# Patient Record
Sex: Female | Born: 1995 | Race: Black or African American | Hispanic: No | Marital: Single | Smoking: Never smoker
Health system: Southern US, Community
[De-identification: ages and names within clinical notes are randomized; demographics above are authoritative.]

## PROBLEM LIST (undated history)

## (undated) DIAGNOSIS — Z950 Presence of cardiac pacemaker: Secondary | ICD-10-CM

## (undated) DIAGNOSIS — G459 Transient cerebral ischemic attack, unspecified: Secondary | ICD-10-CM

## (undated) DIAGNOSIS — I459 Conduction disorder, unspecified: Secondary | ICD-10-CM

## (undated) DIAGNOSIS — I82409 Acute embolism and thrombosis of unspecified deep veins of unspecified lower extremity: Secondary | ICD-10-CM

## (undated) HISTORY — PX: PACEMAKER INSERTION: SHX728

---
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
## 2016-07-14 ENCOUNTER — Other Ambulatory Visit (HOSPITAL_COMMUNITY)
Admission: EM | Admit: 2016-07-14 | Discharge: 2016-07-14 | Disposition: A | Discharge status: Home / Self Care | Payer: Medicaid Other | Source: Intra-hospital | Attending: Internal Medicine | Admitting: Internal Medicine

## 2016-07-14 DIAGNOSIS — Z029 Encounter for administrative examinations, unspecified: Secondary | ICD-10-CM | POA: Insufficient documentation

## 2016-07-14 LAB — PROTIME-INR
INR: 1.49
PROTHROMBIN TIME: 18.2 s — AB (ref 11.4–15.2)

## 2016-08-01 ENCOUNTER — Inpatient Hospital Stay (HOSPITAL_COMMUNITY)
Admission: EM | Admit: 2016-08-01 | Discharge: 2016-08-06 | DRG: 312 | Disposition: A | Discharge status: Home / Self Care | Payer: Medicaid Other | Attending: Internal Medicine | Admitting: Internal Medicine

## 2016-08-01 ENCOUNTER — Emergency Department (HOSPITAL_COMMUNITY): Payer: Medicaid Other

## 2016-08-01 ENCOUNTER — Encounter (HOSPITAL_COMMUNITY): Payer: Self-pay | Admitting: Emergency Medicine

## 2016-08-01 DIAGNOSIS — Z86718 Personal history of other venous thrombosis and embolism: Secondary | ICD-10-CM

## 2016-08-01 DIAGNOSIS — R51 Headache: Secondary | ICD-10-CM | POA: Diagnosis not present

## 2016-08-01 DIAGNOSIS — R791 Abnormal coagulation profile: Secondary | ICD-10-CM | POA: Diagnosis present

## 2016-08-01 DIAGNOSIS — Q246 Congenital heart block: Secondary | ICD-10-CM | POA: Diagnosis not present

## 2016-08-01 DIAGNOSIS — R55 Syncope and collapse: Principal | ICD-10-CM | POA: Diagnosis present

## 2016-08-01 DIAGNOSIS — Z79899 Other long term (current) drug therapy: Secondary | ICD-10-CM

## 2016-08-01 DIAGNOSIS — R4781 Slurred speech: Secondary | ICD-10-CM | POA: Diagnosis present

## 2016-08-01 DIAGNOSIS — Z950 Presence of cardiac pacemaker: Secondary | ICD-10-CM | POA: Diagnosis present

## 2016-08-01 DIAGNOSIS — Z7901 Long term (current) use of anticoagulants: Secondary | ICD-10-CM

## 2016-08-01 DIAGNOSIS — R519 Headache, unspecified: Secondary | ICD-10-CM | POA: Diagnosis present

## 2016-08-01 DIAGNOSIS — Z8673 Personal history of transient ischemic attack (TIA), and cerebral infarction without residual deficits: Secondary | ICD-10-CM

## 2016-08-01 DIAGNOSIS — G8929 Other chronic pain: Secondary | ICD-10-CM | POA: Diagnosis present

## 2016-08-01 HISTORY — DX: Presence of cardiac pacemaker: Z95.0

## 2016-08-01 HISTORY — DX: Conduction disorder, unspecified: I45.9

## 2016-08-01 HISTORY — DX: Acute embolism and thrombosis of unspecified deep veins of unspecified lower extremity: I82.409

## 2016-08-01 HISTORY — DX: Transient cerebral ischemic attack, unspecified: G45.9

## 2016-08-01 LAB — URINALYSIS, ROUTINE W REFLEX MICROSCOPIC
Bilirubin Urine: NEGATIVE
Glucose, UA: NEGATIVE mg/dL
Hgb urine dipstick: NEGATIVE
KETONES UR: NEGATIVE mg/dL
NITRITE: NEGATIVE
PH: 6.5 (ref 5.0–8.0)
Protein, ur: NEGATIVE mg/dL
SPECIFIC GRAVITY, URINE: 1.015 (ref 1.005–1.030)

## 2016-08-01 LAB — COMPREHENSIVE METABOLIC PANEL
ALBUMIN: 4.3 g/dL (ref 3.5–5.0)
ALT: 14 U/L (ref 14–54)
ANION GAP: 7 (ref 5–15)
AST: 23 U/L (ref 15–41)
Alkaline Phosphatase: 53 U/L (ref 38–126)
BUN: 10 mg/dL (ref 6–20)
CALCIUM: 9.6 mg/dL (ref 8.9–10.3)
CHLORIDE: 106 mmol/L (ref 101–111)
CO2: 26 mmol/L (ref 22–32)
Creatinine, Ser: 0.97 mg/dL (ref 0.44–1.00)
GFR calc non Af Amer: >60 mL/min (ref >60)
GLUCOSE: 83 mg/dL (ref 65–99)
POTASSIUM: 4.1 mmol/L (ref 3.5–5.1)
SODIUM: 139 mmol/L (ref 135–145)
Total Bilirubin: 1.4 mg/dL — ABNORMAL HIGH (ref 0.3–1.2)
Total Protein: 7.9 g/dL (ref 6.5–8.1)

## 2016-08-01 LAB — I-STAT CHEM 8, ED
BUN: 12 mg/dL (ref 6–20)
CALCIUM ION: 1.17 mmol/L (ref 1.15–1.40)
Chloride: 103 mmol/L (ref 101–111)
Creatinine, Ser: 0.9 mg/dL (ref 0.44–1.00)
GLUCOSE: 81 mg/dL (ref 65–99)
HCT: 43 % (ref 36.0–46.0)
HEMOGLOBIN: 14.6 g/dL (ref 12.0–15.0)
Potassium: 4 mmol/L (ref 3.5–5.1)
SODIUM: 140 mmol/L (ref 135–145)
TCO2: 25 mmol/L (ref 0–100)

## 2016-08-01 LAB — CBC WITH DIFFERENTIAL/PLATELET
BASOS ABS: 0 10*3/uL (ref 0.0–0.1)
Basophils Relative: 0 %
EOS ABS: 0.1 10*3/uL (ref 0.0–0.7)
EOS PCT: 1 %
HCT: 39.5 % (ref 36.0–46.0)
Hemoglobin: 14 g/dL (ref 12.0–15.0)
LYMPHS ABS: 1.8 10*3/uL (ref 0.7–4.0)
LYMPHS PCT: 34 %
MCH: 31.7 pg (ref 26.0–34.0)
MCHC: 35.4 g/dL (ref 30.0–36.0)
MCV: 89.4 fL (ref 78.0–100.0)
MONO ABS: 0.2 10*3/uL (ref 0.1–1.0)
Monocytes Relative: 4 %
Neutro Abs: 3.2 10*3/uL (ref 1.7–7.7)
Neutrophils Relative %: 61 %
PLATELETS: 176 10*3/uL (ref 150–400)
RBC: 4.42 MIL/uL (ref 3.87–5.11)
RDW: 12.1 % (ref 11.5–15.5)
WBC: 5.2 10*3/uL (ref 4.0–10.5)

## 2016-08-01 LAB — CBG MONITORING, ED: Glucose-Capillary: 89 mg/dL (ref 65–99)

## 2016-08-01 LAB — PROTIME-INR
INR: 1.77
PROTHROMBIN TIME: 20.8 s — AB (ref 11.4–15.2)

## 2016-08-01 LAB — I-STAT BETA HCG BLOOD, ED (MC, WL, AP ONLY): I-stat hCG, quantitative: <5 m[IU]/mL (ref <5)

## 2016-08-01 LAB — URINALYSIS, MICROSCOPIC (REFLEX): RBC / HPF: NONE SEEN RBC/hpf (ref 0–5)

## 2016-08-01 MED ORDER — WARFARIN - PHARMACIST DOSING INPATIENT
Freq: Every day | Status: DC
  Administered 2016-08-03 – 2016-08-04 (×2)

## 2016-08-01 MED ORDER — WARFARIN SODIUM 6 MG PO TABS
6.0000 mg | ORAL_TABLET | Freq: Once | ORAL | Status: AC
  Administered 2016-08-01: 6 mg via ORAL
  Filled 2016-08-01: qty 1

## 2016-08-01 MED ORDER — SODIUM CHLORIDE 0.9% FLUSH
3.0000 mL | Freq: Two times a day (BID) | INTRAVENOUS | Status: DC
  Administered 2016-08-02 – 2016-08-05 (×3): 3 mL via INTRAVENOUS

## 2016-08-01 MED ORDER — SODIUM CHLORIDE 0.9% FLUSH
3.0000 mL | Freq: Two times a day (BID) | INTRAVENOUS | Status: DC
  Administered 2016-08-02 – 2016-08-05 (×5): 3 mL via INTRAVENOUS

## 2016-08-01 MED ORDER — SODIUM CHLORIDE 0.9 % IV SOLN: 250.0000 mL | INTRAVENOUS | Status: DC | PRN

## 2016-08-01 MED ORDER — SODIUM CHLORIDE 0.9% FLUSH: 3.0000 mL | INTRAVENOUS | Status: DC | PRN

## 2016-08-01 MED ORDER — ATENOLOL 25 MG PO TABS: 25.0000 mg | ORAL_TABLET | Freq: Every day | ORAL | Status: DC

## 2016-08-01 MED ORDER — ATENOLOL 25 MG PO TABS
25.0000 mg | ORAL_TABLET | Freq: Once | ORAL | Status: AC
  Administered 2016-08-01: 25 mg via ORAL
  Filled 2016-08-01: qty 1

## 2016-08-01 MED ORDER — IBUPROFEN 600 MG PO TABS: 600.0000 mg | ORAL_TABLET | Freq: Four times a day (QID) | ORAL | Status: DC | PRN

## 2016-08-01 MED ORDER — ACETAMINOPHEN 650 MG RE SUPP: 650.0000 mg | Freq: Four times a day (QID) | RECTAL | Status: DC | PRN

## 2016-08-01 MED ORDER — ACETAMINOPHEN 325 MG PO TABS: 650.0000 mg | ORAL_TABLET | Freq: Four times a day (QID) | ORAL | Status: DC | PRN

## 2016-08-01 MED ORDER — WARFARIN SODIUM 1 MG PO TABS: 1.0000 mg | ORAL_TABLET | Freq: Once | ORAL | Status: DC

## 2016-08-01 MED ORDER — WARFARIN SODIUM 5 MG PO TABS: 5.0000 mg | ORAL_TABLET | Freq: Once | ORAL | Status: DC

## 2016-08-01 NOTE — ED Provider Notes (Signed)
[facility]-EM[facility]ENCY DEPT Provider Note   CSN: [id number] Arrival date & time: 08/01/16  1831     History   Chief Complaint Chief Complaint  Patient presents with  . Loss of Consciousness    HPI Elizabeth Ballard is a 20 y.o. female with a very complicated past medical history. [place] and [facility] has [facility]. 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 She has a history of a DVT forming on 1 of the leads of her pacemaker. The clot embolized and she ended up having a TIA. She also has a previous history of DVT. Today, the Patient states that she was at her desk studying when she had sudden onset of acute frontal headache. The patient was using her SnapChat app and has a video of herself syncopizing during the onset of the headache. The patient states that it happened a second time after she woke up. She called her mother who is on the speaker phone during the interview and states that she had significantly slurred speech for about 15 minutes. The patient was able to call 911 and came to the emergency department.  Patient continues to have a significant frontal headache. She is on Coumadin and states that she has been taking it as she is directed and has not been eating any green vegetables. Patient was seen by her cardiologist last month and had her pacemaker interrogated. She states that they told her, "the top part of my heart was not beating correctly with the bottom part of my heart." The patient denies any chest pain or shortness of breath. She does not have a history of significant headaches.  HPI  Past Medical History:  Diagnosis Date  . Acute deep vein thrombosis (DVT) (HCC)   . Heart block   . TIA (transient ischemic attack)     There are no active problems to display for this patient.   Past Surgical History:  Procedure Laterality Date  . PACEMAKER  INSERTION      OB History    No data available       Home Medications    Prior to Admission medications   Not on File    Family History No family history on file.  Social History Social History  Substance Use Topics  . Smoking status: Never Smoker  . Smokeless tobacco: Never Used  . Alcohol use Yes     Allergies   Patient has no allergy information on record.   Review of Systems Review of Systems  Ten systems reviewed and are negative for acute change, except as noted in the HPI.   Physical Exam Updated Vital Signs BP 123/87 (BP Location: Right Arm)   Pulse 72   Temp 98.7 F (37.1 C) (Oral)   Resp 16   Ht 5\' 3"  (1.6 m)   Wt 70.3 kg   LMP 07/06/2016 (Approximate)   SpO2 100%   BMI 27.46 kg/m   Physical Exam  Constitutional: She is oriented to person, place, and time. She appears well-developed and well-nourished. No distress.  HENT:  Head: Normocephalic and atraumatic.  Eyes: Conjunctivae are normal. No scleral icterus.  Neck: Normal range of motion.  Cardiovascular: Normal rate, regular rhythm and normal heart sounds.  Exam reveals no gallop and no friction rub.   No murmur heard. Well healed surgical scars on the chest  Pulmonary/Chest: Effort normal and breath  sounds normal. No respiratory distress.  Abdominal: Soft. Bowel sounds are normal. She exhibits no distension and no mass. There is no tenderness. There is no guarding.  Neurological: She is alert and oriented to person, place, and time.  Speech is clear and goal oriented, follows commands Major Cranial nerves without deficit, no facial droop Normal strength in upper and lower extremities bilaterally including dorsiflexion and plantar flexion, strong and equal grip strength Sensation normal to light and sharp touch Moves extremities without ataxia, coordination intact Normal finger to nose and rapid alternating movements Neg romberg, no pronator drift Normal gait Normal heel-shin and  balance  Skin: Skin is warm and dry. She is not diaphoretic.  Nursing note and vitals reviewed.    ED Treatments / Results  Labs (all labs ordered are listed, but only abnormal results are displayed) Labs Reviewed  CBC WITH DIFFERENTIAL/PLATELET  COMPREHENSIVE METABOLIC PANEL  URINALYSIS, ROUTINE W REFLEX MICROSCOPIC  PROTIME-INR  POCT CBG (FASTING - GLUCOSE)-MANUAL ENTRY  I-STAT CHEM 8, ED  I-STAT BETA HCG BLOOD, ED ([facility], [facility], [facility] ONLY)    EKG  EKG Interpretation None       Radiology No results found.  Procedures Procedures (including critical care time)  Medications Ordered in ED Medications - No data to display   Initial Impression / Assessment and Plan / ED Course  I have reviewed the triage vital signs and the nursing notes.  Pertinent labs & imaging results that were available during my care of the patient were reviewed by me and considered in my medical decision making (see chart for details).  Clinical Course as of Aug 02 2027  Wed Aug 01, 2016  2025 Patient Medtronic pace maker evaluated. No abnormalities. EKG pending. Ct pending. Patient seen in shared visit with Dr. Jacqulyn BathLong. I have given sign out to PA Muthersbaugh.   [AH]    Clinical Course User Index [AH] Arthor CaptainAbigail Jubilee Vivero, PA-C      Final Clinical Impressions(s) / ED Diagnoses   Final diagnoses:  None    New Prescriptions New Prescriptions   No medications on file     Arthor Captainbigail Evany Schecter, PA-C 08/01/16 2029    Maia PlanJoshua G Long, MD 08/01/16 [phone]

## 2016-08-01 NOTE — ED Provider Notes (Signed)
Care assumed from [place], [place].  Elizabeth Ballard is a 20 y.o. female presents with a past medical history of complete heart block with pacemaker insertion at age 853 days old, DVT secondary to clot on the lead of her pacemaker causing TIA. She is anticoagulated on Coumadin. Denies missed doses or eating green vegetables. Today she was at her desk studying when she developed a sudden onset headache and had syncope 2. Mother states 15 minutes of slurred speech.  On initial providers exam patient with normal neurologic exam. Patient was without chest pain or shortness of breath during the incident. No prodrome.  Physical Exam  BP 108/83 (BP Location: Right Arm)   Pulse 70   Temp 98.7 F (37.1 C) (Oral)   Resp 26   Ht 5\' 3"  (1.6 m)   Wt 70.3 kg   LMP 07/06/2016 (Approximate)   SpO2 100%   BMI 27.46 kg/m   Physical Exam  Constitutional: She appears well-developed and well-nourished. No distress.  HENT:  Head: Normocephalic.  Eyes: Conjunctivae are normal. No scleral icterus.  Neck: Normal range of motion.  Cardiovascular: Normal rate and intact distal pulses.   Murmur heard. Pulmonary/Chest: Effort normal.  Musculoskeletal: Normal range of motion.  Neurological: She is alert.  Skin: Skin is warm and dry.   Results for orders placed or performed during the hospital encounter of 08/01/16  CBC WITH DIFFERENTIAL  Result Value Ref Range   WBC 5.2 4.0 - 10.5 K/uL   RBC 4.42 3.87 - 5.11 MIL/uL   Hemoglobin 14.0 12.0 - 15.0 g/dL   HCT 16.139.5 09.636.0 - 04.546.0 %   MCV 89.4 78.0 - 100.0 fL   MCH 31.7 26.0 - 34.0 pg   MCHC 35.4 30.0 - 36.0 g/dL   RDW 40.912.1 81.111.5 - 91.415.5 %   Platelets 176 150 - 400 K/uL   Neutrophils Relative % 61 %   Neutro Abs 3.2 1.7 - 7.7 K/uL   Lymphocytes Relative 34 %   Lymphs Abs 1.8 0.7 - 4.0 K/uL   Monocytes Relative 4 %   Monocytes Absolute 0.2 0.1 - 1.0 K/uL   Eosinophils Relative 1 %   Eosinophils Absolute 0.1 0.0 - 0.7 K/uL   Basophils Relative 0 %    Basophils Absolute 0.0 0.0 - 0.1 K/uL  Comprehensive metabolic panel  Result Value Ref Range   Sodium 139 135 - 145 mmol/L   Potassium 4.1 3.5 - 5.1 mmol/L   Chloride 106 101 - 111 mmol/L   CO2 26 22 - 32 mmol/L   Glucose, Bld 83 65 - 99 mg/dL   BUN 10 6 - 20 mg/dL   Creatinine, Ser 7.820.97 0.44 - 1.00 mg/dL   Calcium 9.6 8.9 - 95.610.3 mg/dL   Total Protein 7.9 6.5 - 8.1 g/dL   Albumin 4.3 3.5 - 5.0 g/dL   AST 23 15 - 41 U/L   ALT 14 14 - 54 U/L   Alkaline Phosphatase 53 38 - 126 U/L   Total Bilirubin 1.4 (H) 0.3 - 1.2 mg/dL   GFR calc non Af Amer >60 >60 mL/min   GFR calc Af Amer >60 >60 mL/min   Anion gap 7 5 - 15  Urinalysis, Routine w reflex microscopic  Result Value Ref Range   Color, Urine YELLOW YELLOW   APPearance CLEAR CLEAR   Specific Gravity, Urine 1.015 1.005 - 1.030   pH 6.5 5.0 - 8.0   Glucose, UA NEGATIVE NEGATIVE mg/dL   Hgb urine dipstick NEGATIVE NEGATIVE  Bilirubin Urine NEGATIVE NEGATIVE   Ketones, ur NEGATIVE NEGATIVE mg/dL   Protein, ur NEGATIVE NEGATIVE mg/dL   Nitrite NEGATIVE NEGATIVE   Leukocytes, UA TRACE (A) NEGATIVE  Protime-INR  Result Value Ref Range   Prothrombin Time 20.8 (H) 11.4 - 15.2 seconds   INR 1.77   Urinalysis, Microscopic (reflex)  Result Value Ref Range   RBC / HPF NONE SEEN 0 - 5 RBC/hpf   WBC, UA 0-5 0 - 5 WBC/hpf   Bacteria, UA RARE (A) NONE SEEN   Squamous Epithelial / LPF 0-5 (A) NONE SEEN  I-stat chem 8, ED  (not at [facility])  Result Value Ref Range   Sodium 140 135 - 145 mmol/L   Potassium 4.0 3.5 - 5.1 mmol/L   Chloride 103 101 - 111 mmol/L   BUN 12 6 - 20 mg/dL   Creatinine, Ser 4.090.90 0.44 - 1.00 mg/dL   Glucose, Bld 81 65 - 99 mg/dL   Calcium, Ion 8.111.17 9.141.15 - 1.40 mmol/L   TCO2 25 0 - 100 mmol/L   Hemoglobin 14.6 12.0 - 15.0 g/dL   HCT 78.243.0 95.636.0 - 21.346.0 %  I-Stat Beta hCG blood, ED ([facility], [facility], [facility] only)  Result Value Ref Range   I-stat hCG, quantitative <5.0 <5 mIU/mL   Comment 3          CBG monitoring, ED   Result Value Ref Range   Glucose-Capillary 89 65 - 99 mg/dL   Comment 1 Document in Chart    Dg Chest 2 View  Result Date: 08/01/2016 CLINICAL DATA:  Headache, syncope. EXAM: CHEST  2 VIEW COMPARISON:  None. FINDINGS: Left-sided pacemaker with leads projecting over the right atrium and ventricle. We from prior abdominal pacemaker also in place.The cardiomediastinal contours are normal. The lungs are clear. Pulmonary vasculature is normal. No consolidation, pleural effusion, or pneumothorax. No acute osseous abnormalities are seen. IMPRESSION: No active cardiopulmonary disease. Pacemaker in place. Electronically Signed   By: Rubye OaksMelanie  Ehinger M.D.   On: 08/01/2016 20:49   Ct Head Wo Contrast  Result Date: 08/01/2016 CLINICAL DATA:  Dizzy. Syncopal episode while at home alone. Headache. EXAM: CT HEAD WITHOUT CONTRAST TECHNIQUE: Contiguous axial images were obtained from the base of the skull through the vertex without intravenous contrast. COMPARISON:  None. FINDINGS: Brain: No acute infarct, hemorrhage, or mass lesion is present. The ventricles are of normal size. No significant extraaxial fluid collection is present. No significant white matter disease is present. The brainstem and cerebellum are normal. Vascular: No hyperdense vessel or unexpected calcification. Skull: The calvarium is within normal limits. No focal lytic or blastic lesions are present. Sinuses/Orbits: The paranasal sinuses and mastoid air cells are clear. The globes and orbits are intact. Other: IMPRESSION: Negative CT of the head. Electronically Signed   By: Marin Robertshristopher  Mattern M.D.   On: 08/01/2016 20:42    ED Course  Procedures  Clinical Course as of Aug 01 2318  Wed Aug 01, 2016  2025 Patient Medtronic pace maker evaluated. No abnormalities. EKG pending. Ct pending. Patient seen in shared visit with Dr. Jacqulyn BathLong. I have given sign out to PA Avianna Moynahan.   [AH]  2046 Plan: CT scan, orthostatic vital signs, EKG, chest x-ray  are pending. Anticipate admission.  [HM]  2046 INR is subtherapeutic. INR: 1.77 [HM]  2046 No anemia and patient denies any bleeding. Hemoglobin: 14.0 [HM]  2046 I-stat hCG, quantitative: <5.0 [HM]  2046 No evidence of UTI. Leukocytes, UA: (!) TRACE [HM]  2046  No leukocytosis. WBC: 5.2 [HM]  2047 No electrolyte abnormalities. Potassium: 4.1 [HM]  2047 Atrial sensed ventricular pacemaker EKG 12-Lead [HM]  2047 CT scan without evidence of ICH. This was completed within a 6 hour window of headache onset. Doubt subarachnoid hemorrhage. CT Head Wo Contrast [HM]  2047 Vital signs stable. No tachycardia. BP: 123/87 [HM]  2248 No cardiac pulmonary process. Pacemaker noted. DG Chest 2 View [HM]  2249 Discussed with Dr. Antionette Char who will admit to tele obs.  Transfer of care to hospitalist service.  [HM]    Clinical Course User Index [AH] Arthor Captain, PA-C [HM] Dierdre Forth, PA-C     MDM Patient with syncope without prodrome at home. Associated with headache. No overt seizure activity but did have some eye fluttering. Has never followed up with neurology. Normal neurologic exam today for initial provider. Pacemaker interrogation without abnormality. CT scan without evidence of subarachnoid hemorrhage. Patient will need to be admitted for further workup for possible TIA versus seizure activity. Discussed with patient is amenable to the plan.  Syncope and collapse  Pacemaker  Nonintractable headache, unspecified chronicity pattern, unspecified headache type     Dierdre Forth, PA-C 08/01/16 2319    Maia Plan, MD 08/01/16 [phone]

## 2016-08-01 NOTE — H&P (Signed)
History and Physical    Elizabeth Ballard ZOX:[id number] DOB: 10-08-1995 DOA: 08/01/2016  PCP: Pcp Not In System   Patient coming from: Home Chief Complaint: Syncope  HPI: Elizabeth Ballard is a 20 y.o. female with medical history significant for congenital heart block with pacer, history of DVT on Coumadin, and chronic headaches who presents to the emergency department after 2 syncopal episodes at home. Patient reports that she woke in her usual state of health today and was having an uneventful day when she noted development of a frontal headache this evening. She describes the headache as the same as her typical chronic headache, without nausea or change in vision or hearing. Shortly after development of a headache, the patient reports experiencing a syncopal episode, and then a second syncopal episode later tonight. Her mother was concerned that she was slurring her speech immediately after this episode and patient comes in to the ED for evaluation of this. She reports a very similar episode approximately 2 years ago which was ultimately attributed to a TIA. She denies any recent fevers or chills, chest pain or palpitations, leg swelling or orthopnea. She denies any recent fall or trauma and denies change in vision or hearing, loss of coordination, confusion, or focal numbness or weakness. Patient is accompanied by her cousin who notes that the patient has seemed to be increasingly anxious since recently starting college, notes that the patient has a history of anxiety, and offers her opinion that this is likely a manifestation of that.  ED Course: Upon arrival to the ED, patient is found to be afebrile, saturating well on room air, and with vital signs stable. EKG reveals a ventricular paced rhythm. Chest x-ray is negative for acute cardiopulmonary disease and noncontrast head CT is negative for acute intracranial abnormality. CMP is unremarkable and CBC is also within the normal limits. Urinalysis  is unremarkable and INR is subtherapeutic at 1.77. Patient was given her evening doses of atenolol and warfarin in the emergency department. There were no focal neurologic deficits observed in the ED and the patient reports resolution of her headache. She will be observed on the telemetry unit for ongoing evaluation and management of syncope.  Review of Systems:  All other systems reviewed and apart from HPI, are negative.  Past Medical History:  Diagnosis Date  . Acute deep vein thrombosis (DVT) (HCC)   . Heart block   . TIA (transient ischemic attack)     Past Surgical History:  Procedure Laterality Date  . PACEMAKER INSERTION       reports that she has never smoked. She has never used smokeless tobacco. She reports that she drinks alcohol. Her drug history is not on file.  No Known Allergies  History reviewed. No pertinent family history.   Prior to Admission medications   Medication Sig Start Date End Date Taking? Authorizing Provider  atenolol (TENORMIN) 25 MG tablet Take 25 mg by mouth daily.   Yes Historical Provider, MD  warfarin (COUMADIN) 1 MG tablet Take 6 mg by mouth daily.   Yes Historical Provider, MD  warfarin (COUMADIN) 5 MG tablet Take 6 mg by mouth daily.   Yes Historical Provider, MD    Physical Exam: Vitals:   08/01/16 1954 08/01/16 1955 08/01/16 2115 08/01/16 2145  BP: 117/74 108/83 106/78 126/83  Pulse: 68 70 64 71  Resp: 18 26 18 24   Temp:      TempSrc:      SpO2: 100% 100% 100% 100%  Weight:  Height:          Constitutional: NAD, calm, comfortable Eyes: PERTLA, lids and conjunctivae normal ENMT: Mucous membranes are moist. Posterior pharynx clear of any exudate or lesions.   Neck: normal, supple, no masses, no thyromegaly Respiratory: clear to auscultation bilaterally, no wheezing, no crackles. Normal respiratory effort.   Cardiovascular: S1 & S2 heard, regular rate and rhythm, no significant murmur. No extremity edema. No carotid bruits.  No significant JVD. Abdomen: No distension, no tenderness, no masses palpated. Bowel sounds normal.  Musculoskeletal: no clubbing / cyanosis. No joint deformity upper and lower extremities. Normal muscle tone.  Skin: no significant rashes, lesions, ulcers. Warm, dry, well-perfused. Neurologic: CN 2-12 grossly intact. Sensation intact, DTR normal. Strength 5/5 in all 4 limbs.  Psychiatric: Normal judgment and insight. Alert and oriented x 3. Normal mood and affect.     Labs on Admission: I have personally reviewed following labs and imaging studies  CBC:  Recent Labs Lab 08/01/16 1929 08/01/16 1937  WBC 5.2  --   NEUTROABS 3.2  --   HGB 14.0 14.6  HCT 39.5 43.0  MCV 89.4  --   PLT 176  --    Basic Metabolic Panel:  Recent Labs Lab 08/01/16 1929 08/01/16 1937  NA 139 140  K 4.1 4.0  CL 106 103  CO2 26  --   GLUCOSE 83 81  BUN 10 12  CREATININE 0.97 0.90  CALCIUM 9.6  --    GFR: Estimated Creatinine Clearance: 94.6 mL/min (by C-G formula based on SCr of 0.9 mg/dL). Liver Function Tests:  Recent Labs Lab 08/01/16 1929  AST 23  ALT 14  ALKPHOS 53  BILITOT 1.4*  PROT 7.9  ALBUMIN 4.3   No results for input(s): LIPASE, AMYLASE in the last 168 hours. No results for input(s): AMMONIA in the last 168 hours. Coagulation Profile:  Recent Labs Lab 08/01/16 1929  INR 1.77   Cardiac Enzymes: No results for input(s): CKTOTAL, CKMB, CKMBINDEX, TROPONINI in the last 168 hours. BNP (last 3 results) No results for input(s): PROBNP in the last 8760 hours. HbA1C: No results for input(s): HGBA1C in the last 72 hours. CBG:  Recent Labs Lab 08/01/16 1950  GLUCAP 89   Lipid Profile: No results for input(s): CHOL, HDL, LDLCALC, TRIG, CHOLHDL, LDLDIRECT in the last 72 hours. Thyroid Function Tests: No results for input(s): TSH, T4TOTAL, FREET4, T3FREE, THYROIDAB in the last 72 hours. Anemia Panel: No results for input(s): VITAMINB12, FOLATE, FERRITIN, TIBC, IRON,  RETICCTPCT in the last 72 hours. Urine analysis:    Component Value Date/Time   COLORURINE YELLOW 08/01/2016 1858   APPEARANCEUR CLEAR 08/01/2016 1858   LABSPEC 1.015 08/01/2016 1858   PHURINE 6.5 08/01/2016 1858   GLUCOSEU NEGATIVE 08/01/2016 1858   HGBUR NEGATIVE 08/01/2016 1858   BILIRUBINUR NEGATIVE 08/01/2016 1858   KETONESUR NEGATIVE 08/01/2016 1858   PROTEINUR NEGATIVE 08/01/2016 1858   NITRITE NEGATIVE 08/01/2016 1858   LEUKOCYTESUR TRACE (A) 08/01/2016 1858   Sepsis Labs: @LABRCNTIP (procalcitonin:4,lacticidven:4) )No results found for this or any previous visit (from the past 240 hour(s)).   Radiological Exams on Admission: Dg Chest 2 View  Result Date: 08/01/2016 CLINICAL DATA:  Headache, syncope. EXAM: CHEST  2 VIEW COMPARISON:  None. FINDINGS: Left-sided pacemaker with leads projecting over the right atrium and ventricle. We from prior abdominal pacemaker also in place.The cardiomediastinal contours are normal. The lungs are clear. Pulmonary vasculature is normal. No consolidation, pleural effusion, or pneumothorax. No acute osseous abnormalities are  seen. IMPRESSION: No active cardiopulmonary disease. Pacemaker in place. Electronically Signed   By: Rubye OaksMelanie  Ehinger M.D.   On: 08/01/2016 20:49   Ct Head Wo Contrast  Result Date: 08/01/2016 CLINICAL DATA:  Dizzy. Syncopal episode while at home alone. Headache. EXAM: CT HEAD WITHOUT CONTRAST TECHNIQUE: Contiguous axial images were obtained from the base of the skull through the vertex without intravenous contrast. COMPARISON:  None. FINDINGS: Brain: No acute infarct, hemorrhage, or mass lesion is present. The ventricles are of normal size. No significant extraaxial fluid collection is present. No significant white matter disease is present. The brainstem and cerebellum are normal. Vascular: No hyperdense vessel or unexpected calcification. Skull: The calvarium is within normal limits. No focal lytic or blastic lesions are  present. Sinuses/Orbits: The paranasal sinuses and mastoid air cells are clear. The globes and orbits are intact. Other: IMPRESSION: Negative CT of the head. Electronically Signed   By: Marin Robertshristopher  Mattern M.D.   On: 08/01/2016 20:42    EKG: Independently reviewed. Atrial-paced, ventricular-sensed  Assessment/Plan  1. Syncope  - Pt presents following 2 syncopal events at home, preceded by one of her typical chronic frontal HAs - She denies any associated CP, palpitations, nausea, diaphoresis, or SOB - She describes a very similar scenario occurring 1-2 yrs ago with negative workup, and reportedly attributed to TIA - Pt's mother was concerned for slurred speech immediately following syncope  - Pt is completely asymptomatic in ED and reports feeling like her normal self  - There is no evidence for PE as an etiology and initial w/u with head CT, CXR, EKG, and basic labs in unremarkable  - Given the patient's cardiac hx, she will be observed on telemetry for further evaluation with orthostatic vitals, echocardiography, cardiac monitoring   2. Congenital heart block with pacer  - Pacer was interrogated in ED and functioning appropriately with no events    3. Headache, chronic headaches - Pt reports frequent mild-moderate frontal HA without other sxs  - HA associated with the syncopal episodes had resolved spontaneously prior to admission  - Head CT negative and no focal deficits observed    4. History of DVT - Pt has been managed with warfarin for hx of DVT  - INR is subtherapeutic at 1.77 on admission, but there is no evidence for acute VTE  - Continue warfarin as tolerated   DVT prophylaxis: warfarin  Code Status: Full  Family Communication: Cousin updated at bedside at patient's request Disposition Plan: Observe on telemetry Consults called: None Admission status: Observation    Briscoe Deutscherimothy S Allessandra Bernardi, MD [facility] Pager [phone]  If 7PM-7AM, please contact  night-coverage [email or web address] Password [email or web address]  08/01/2016, 11:19 PM

## 2016-08-01 NOTE — ED Notes (Signed)
Pt is currently on blood thinners

## 2016-08-01 NOTE — ED Triage Notes (Signed)
Pt to ED via [facility] with c/o headache, and syncope x's 2.  Pt st's she was diagnosed with TIA one year ago.  Pt also st's she had slurred speech which lasted approx 15 mins.   On arrival to ED pt alert and oriented x's 3. Speech clear but continue to c/o frontal headache,.

## 2016-08-01 NOTE — Progress Notes (Signed)
ANTICOAGULATION CONSULT NOTE - Initial Consult  Pharmacy Consult for Coumadin Indication: h/o DVT/TIA  No Known Allergies  Patient Measurements: Height: 5\' 3"  (160 cm) Weight: 155 lb (70.3 kg) IBW/kg (Calculated) : 52.4  Vital Signs: Temp: 98.7 F (37.1 C) (12/06 1838) Temp Source: Oral (12/06 1838) BP: 126/83 (12/06 2145) Pulse Rate: 71 (12/06 2145)  Labs:  Recent Labs  08/01/16 1929 08/01/16 1937  HGB 14.0 14.6  HCT 39.5 43.0  PLT 176  --   LABPROT 20.8*  --   INR 1.77  --   CREATININE 0.97 0.90    Estimated Creatinine Clearance: 94.6 mL/min (by C-G formula based on SCr of 0.9 mg/dL).   Medical History: Past Medical History:  Diagnosis Date  . Acute deep vein thrombosis (DVT) (HCC)   . Heart block   . TIA (transient ischemic attack)      Assessment: 20yo female c/o significant frontal HA s/p syncopal event, to continue Coumadin for h/o DVT 2/2 clot on pacemaker lead >> TIA; current INR below goal, last dose of Coumadin 12/5 PTA and Coumadin 6mg  PO x1 now ordered by ED PA.  Goal of Therapy:  INR 2-3   Plan:  Will monitor INR for dose adjustments.  Elizabeth Ballard, PharmD, BCPS  08/01/2016,11:30 PM

## 2016-08-02 ENCOUNTER — Observation Stay (HOSPITAL_BASED_OUTPATIENT_CLINIC_OR_DEPARTMENT_OTHER): Payer: Medicaid Other

## 2016-08-02 ENCOUNTER — Observation Stay (HOSPITAL_COMMUNITY): Payer: Medicaid Other

## 2016-08-02 ENCOUNTER — Encounter (HOSPITAL_COMMUNITY): Payer: Self-pay | Admitting: General Practice

## 2016-08-02 DIAGNOSIS — R55 Syncope and collapse: Secondary | ICD-10-CM

## 2016-08-02 DIAGNOSIS — R51 Headache: Secondary | ICD-10-CM | POA: Diagnosis not present

## 2016-08-02 DIAGNOSIS — Z86718 Personal history of other venous thrombosis and embolism: Secondary | ICD-10-CM | POA: Diagnosis not present

## 2016-08-02 DIAGNOSIS — Q246 Congenital heart block: Secondary | ICD-10-CM | POA: Diagnosis not present

## 2016-08-02 DIAGNOSIS — R791 Abnormal coagulation profile: Secondary | ICD-10-CM | POA: Diagnosis not present

## 2016-08-02 DIAGNOSIS — R519 Headache, unspecified: Secondary | ICD-10-CM

## 2016-08-02 DIAGNOSIS — Z950 Presence of cardiac pacemaker: Secondary | ICD-10-CM | POA: Diagnosis not present

## 2016-08-02 LAB — PROTIME-INR
INR: 1.91
Prothrombin Time: 22.2 seconds — ABNORMAL HIGH (ref 11.4–15.2)

## 2016-08-02 LAB — RAPID URINE DRUG SCREEN, HOSP PERFORMED
AMPHETAMINES: NOT DETECTED
BENZODIAZEPINES: NOT DETECTED
Barbiturates: NOT DETECTED
COCAINE: NOT DETECTED
Opiates: NOT DETECTED
Tetrahydrocannabinol: NOT DETECTED

## 2016-08-02 LAB — D-DIMER, QUANTITATIVE: D-Dimer, Quant: <0.27 ug/mL-FEU (ref 0.00–0.50)

## 2016-08-02 LAB — ECHOCARDIOGRAM COMPLETE
HEIGHTINCHES: 63 in
Weight: 2479.73 oz

## 2016-08-02 MED ORDER — WARFARIN SODIUM 5 MG PO TABS
6.0000 mg | ORAL_TABLET | Freq: Once | ORAL | Status: AC
  Administered 2016-08-02: 6 mg via ORAL
  Filled 2016-08-02: qty 1

## 2016-08-02 NOTE — Progress Notes (Signed)
ANTICOAGULATION CONSULT NOTE - Follow Up Consult  Pharmacy Consult for Coumadin Indication: h/o DVT  No Known Allergies  Patient Measurements: Height: 5\' 3"  (160 cm) Weight: 154 lb 15.7 oz (70.3 kg) IBW/kg (Calculated) : 52.4  Vital Signs: Temp: 97.7 F (36.5 C) (12/07 0839) Temp Source: Oral (12/07 0839) BP: 108/88 (12/07 1229) Pulse Rate: 62 (12/07 1229)  Labs:  Recent Labs  08/01/16 1929 08/01/16 1937 08/02/16 0159  HGB 14.0 14.6  --   HCT 39.5 43.0  --   PLT 176  --   --   LABPROT 20.8*  --  22.2*  INR 1.77  --  1.91  CREATININE 0.97 0.90  --     Estimated Creatinine Clearance: 94.6 mL/min (by C-G formula based on SCr of 0.9 mg/dL).   Assessment:  Anticoag: continue Coumadin for h/o DVT 2/2 clot on pacemaker lead >> TIA; CBC WnL. INR today up to 1.91 - PTA Coumadin 6mg  daily. INR 1.77 - Pt admits she has not had her INR checked in several months, but she endorses compliance with her warfarin.   Goal of Therapy:  INR 2-3 Monitor platelets by anticoagulation protocol: Yes   Plan:  Cards eval, Echo, EEG, UDS Repeat Coumadin 6mg  po x 1 tonight.  Missi Mcmackin S. Merilynn Finlandobertson, PharmD, BCPS Clinical Staff Pharmacist Pager [phone]  Misty Stanleyobertson, Jaylena Holloway Stillinger 08/02/2016,3:04 PM

## 2016-08-02 NOTE — Progress Notes (Signed)
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 The patient is rather nondescript when asked regarding the details regarding her syncopal episodes. In fact, the patient's cousin has concerns that her episodes may have been related to some anxiety. Nevertheless, patient states that she was sitting on the couch when she had her first syncopal episode without aura. There was no tongue bite, urinary or bowel incontinence. Apparently, the patient called her mother and her mom felt that the patient had some slurred speech. Within a few minutes, while the patient was still sitting down she had another syncopal episode. [place] and states that she has not followed up with her previous cardiologist for several months. She is here attending community college. Furthermore, she has not had her INR checked in several months, but she endorses compliance with her warfarin. Orthostatics were negative. The patient is afebrile and hemodynamically stable.  Assessment/Plan: Syncope -in the setting of congenital heart block and PPM, request cardiology evaluation -echo -orthostatics negative -EEG -UDS -monitor on tele  Congenital Heart Block -consult cardiology  Hx DVT -continue warfarin -PharmD to assist with dosing  Chronic daily Headache -CT brain negative -improved    Disposition Plan:   Hopeful home 12/8 Family Communication:   No Family at bedside--Total time spent 35 minutes.  Greater than 50% spent face to face counseling and coordinating care.   Consultants:  Cardiology  Code Status:  FULL  DVT Prophylaxis:  warfarin   Procedures: As Listed in Progress Note Above  Antibiotics: None    Subjective: Patient  denies fevers, chills, headache, chest pain, dyspnea, nausea, vomiting, diarrhea, abdominal pain, dysuria, hematuria, hematochezia, and melena.   Objective: Vitals:   08/02/16 0600 08/02/16 0700 08/02/16 0839 08/02/16 1006  BP: 92/65 93/70 112/84   Pulse: (!) 57 (!) 55 (!) 58   Resp: 22 14 18    Temp:   97.7 F (36.5 C)   TempSrc:   Oral   SpO2: 99% 99% 100%   Weight:    70.3 kg (154 lb 15.7 oz)  Height:        Intake/Output Summary (Last 24 hours) at 08/02/16 1027 Last data filed at 08/02/16 1007  Gross per 24 hour  Intake                0 ml  Output              350 ml  Net             -350 ml   Weight change:  Exam:   General:  Pt is alert, follows commands appropriately, not in acute distress  HEENT: No icterus, No thrush, No neck mass, [place]/AT  Cardiovascular: RRR, S1/S2, no rubs, no gallops  Respiratory: CTA bilaterally, no wheezing, no crackles, no rhonchi  Abdomen: Soft/+BS, non tender, non distended, no guarding  Extremities: No edema, No lymphangitis, No petechiae, No rashes, no synovitis   Data Reviewed: I have personally reviewed following labs and imaging studies Basic Metabolic Panel:  Recent Labs Lab 08/01/16 1929 08/01/16 1937  NA 139 140  K 4.1 4.0  CL 106 103  CO2 26  --   GLUCOSE 83 81  BUN 10 12  CREATININE 0.97 0.90  CALCIUM 9.6  --    Liver Function Tests:  Recent Labs Lab 08/01/16 1929  AST 23  ALT 14  ALKPHOS 53  BILITOT 1.4*  PROT 7.9  ALBUMIN 4.3   No results for input(s): LIPASE, AMYLASE in the last 168 hours. No results for input(s): AMMONIA in the last 168 hours. Coagulation Profile:  Recent Labs Lab 08/01/16 1929 08/02/16 0159  INR 1.77 1.91   CBC:  Recent Labs Lab 08/01/16 1929 08/01/16 1937  WBC 5.2  --   NEUTROABS 3.2  --   HGB 14.0 14.6  HCT 39.5 43.0  MCV 89.4  --   PLT 176  --    Cardiac Enzymes: No results for input(s): CKTOTAL, CKMB, CKMBINDEX, TROPONINI in the last 168  hours. BNP: Invalid input(s): POCBNP CBG:  Recent Labs Lab 08/01/16 1950  GLUCAP 89   HbA1C: No results for input(s): HGBA1C in the last 72 hours. Urine analysis:    Component Value Date/Time   COLORURINE YELLOW 08/01/2016 1858   APPEARANCEUR CLEAR 08/01/2016 1858   LABSPEC 1.015 08/01/2016 1858   PHURINE 6.5 08/01/2016 1858   GLUCOSEU NEGATIVE 08/01/2016 1858   HGBUR NEGATIVE 08/01/2016 1858   BILIRUBINUR NEGATIVE 08/01/2016 1858   KETONESUR NEGATIVE 08/01/2016 1858   PROTEINUR NEGATIVE 08/01/2016 1858   NITRITE NEGATIVE 08/01/2016 1858   LEUKOCYTESUR TRACE (A) 08/01/2016 1858   Sepsis Labs: @LABRCNTIP (procalcitonin:4,lacticidven:4) )No results found for this or any previous visit (from the past 240 hour(s)).   Scheduled Meds: . sodium chloride flush  3 mL Intravenous Q12H  . sodium chloride flush  3 mL Intravenous Q12H  . Warfarin - Pharmacist Dosing Inpatient   Does not apply q1800   Continuous Infusions:  Procedures/Studies: Dg Chest 2 View  Result Date: 08/01/2016 CLINICAL DATA:  Headache, syncope. EXAM: CHEST  2 VIEW COMPARISON:  None. FINDINGS: Left-sided pacemaker with leads projecting over the right atrium and ventricle. We from prior abdominal pacemaker also in place.The cardiomediastinal contours are normal. The lungs are clear. Pulmonary vasculature is normal. No consolidation, pleural effusion, or pneumothorax. No acute osseous abnormalities are seen. IMPRESSION: No active cardiopulmonary disease. Pacemaker in place. Electronically Signed   By: Rubye OaksMelanie  Ehinger M.D.   On: 08/01/2016 20:49   Ct Head Wo Contrast  Result Date: 08/01/2016 CLINICAL DATA:  Dizzy. Syncopal episode while at home alone. Headache. EXAM: CT HEAD WITHOUT CONTRAST TECHNIQUE: Contiguous axial images were obtained from the base of the skull through the vertex without intravenous contrast. COMPARISON:  None. FINDINGS: Brain: No acute infarct, hemorrhage, or mass lesion is present. The  ventricles are of normal size. No significant extraaxial fluid collection is present. No significant white matter disease is present. The brainstem and cerebellum are normal. Vascular: No hyperdense vessel or unexpected calcification. Skull: The calvarium is within normal limits. No focal lytic or blastic lesions are present. Sinuses/Orbits: The paranasal sinuses and mastoid air cells are clear. The globes and orbits are intact. Other: IMPRESSION: Negative CT of the head. Electronically Signed   By: Marin Robertshristopher  Mattern M.D.   On: 08/01/2016 20:42    Tiler Brandis, DO  [facility] Pager [phone]  If 7PM-7AM, please contact night-coverage [email or web address] Password [email or web address] 08/02/2016, 10:27 AM   LOS: 0 days

## 2016-08-02 NOTE — Procedures (Signed)
History: 20 year old female being evaluated for syncope  Sedation: None  Technique: This is a 21 channel routine scalp EEG performed at the bedside with bipolar and monopolar montages arranged in accordance to the international 10/20 system of electrode placement. One channel was dedicated to EKG recording.    Background: The background consists of intermixed alpha and beta activities. There is a well defined posterior dominant rhythm of 8 Hz that attenuates with eye opening. Sleep is recorded with normal appearing structures.   Photic stimulation: Physiologic driving is present  EEG Abnormalities: None  Clinical Interpretation: This normal EEG is recorded in the waking and sleep state. There was no seizure or seizure predisposition recorded on this study. Please note that a normal EEG does not preclude the possibility of epilepsy.   Ritta SlotMcNeill Ebelin Dillehay, MD [facility] [phone]  If 7pm- 7am, please page neurology on call as listed in AMION.

## 2016-08-02 NOTE — Discharge Instructions (Signed)

## 2016-08-02 NOTE — Consult Note (Signed)
Cardiology Consult    Patient ID: Elizabeth Ballard MRN: [id number], DOB/AGE: 19/01/1996   Admit date: 08/01/2016 Date of Consult: 08/02/2016  Primary Physician: Pcp Not In System [place] Cardiologist[place] Requesting Provider: Dr. Arbutus Leasat Reason for Consultation: Syncope  Patient Profile    20 yo female with PMH of congenital heart block s/p PPM (Medtronic), and DVT who presented with syncope.    Past Medical History   Past Medical History:  Diagnosis Date  . Acute deep vein thrombosis (DVT) (HCC)   . Heart block   . Presence of permanent cardiac pacemaker 1998   FOR CONGENITAL HEART BLOCK  . TIA (transient ischemic attack)     Past Surgical History:  Procedure Laterality Date  . INSERT / REPLACE / REMOVE PACEMAKER  1998   CONGENITAL HEART BLOCK  . PACEMAKER INSERTION       Allergies  No Known Allergies  History of Present Illness    Mrs. Elizabeth Ballard is a [age] yo female with PMH of congenital heart block s/p PPM (medtronic), and recurrent DVT. Reports she had her PPM placed when she was 173 days old. Reports having a DVT that formed on the lead of her PPM, and she had a TIA. Has been followed by [place] cardi[place] in [place] since. States she just saw them last month, and was doing well. Currently living in this area going to college. Has been in her usual state of health.   States she was sitting on the couch yesterday when she developed a sudden onset of headache. Then had a syncopal episode. States she was by herself and unsure of how long this lasted. Then within a few minutes she had another episode. Reports she called her mother and felt like her speech was slurred. Called EMS and presented to the ED.    In the ED labs showed stable electrolytes, Hgb 14.0, INR 1.7. CT head was negative, CXR neg. She was not orthostatic on admission. EKG showed SR Vpaced. Pacemaker was reportedly interrogated in the ED without acute findings. Denies any chest pain, dyspnea,  light-headedness, dizziness, or LE edema.   Inpatient Medications    . sodium chloride flush  3 mL Intravenous Q12H  . sodium chloride flush  3 mL Intravenous Q12H  . warfarin  6 mg Oral ONCE-1800  . Warfarin - Pharmacist Dosing Inpatient   Does not apply q1800    Family History    History reviewed. No pertinent family history.  Social History    Social History   Social History  . Marital status: Single    Spouse name: N/A  . Number of children: N/A  . Years of education: N/A   Occupational History  . Not on file.   Social History Main Topics  . Smoking status: Never Smoker  . Smokeless tobacco: Never Used  . Alcohol use Yes  . Drug use: No  . Sexual activity: Not on file   Other Topics Concern  . Not on file   Social History Narrative  . No narrative on file     Review of Systems    General:  No chills, fever, night sweats or weight changes.  Cardiovascular:  No chest pain, dyspnea on exertion, edema, orthopnea, palpitations, paroxysmal nocturnal dyspnea. Dermatological: No rash, lesions/masses Respiratory: No cough, dyspnea Urologic: No hematuria, dysuria Abdominal:   No nausea, vomiting, diarrhea, bright red blood per rectum, melena, or hematemesis Neurologic: See HPI All other systems reviewed and are otherwise negative except as noted above.  Physical Exam    Blood pressure 108/88, pulse 62, temperature 97.7 F (36.5 C), temperature source Oral, resp. rate (!) 23, height 5\' 3"  (1.6 m), weight 154 lb 15.7 oz (70.3 kg), last menstrual period 07/06/2016, SpO2 100 %.  General: Pleasant young AA female, NAD Psych: Normal affect. Neuro: Alert and oriented X 3. Moves all extremities spontaneously. HEENT: Normal  Neck: Supple without bruits or JVD. Lungs:  Resp regular and unlabored, CTA. Heart: RRR no s3, s4, or murmurs. Abdomen: Soft, non-tender, non-distended, BS + x 4.  Extremities: No clubbing, cyanosis or edema. DP/PT/Radials 2+ and equal  bilaterally.  Labs    Troponin (Point of Care Test) No results for input(s): TROPIPOC in the last 72 hours. No results for input(s): CKTOTAL, CKMB, TROPONINI in the last 72 hours. Lab Results  Component Value Date   WBC 5.2 08/01/2016   HGB 14.6 08/01/2016   HCT 43.0 08/01/2016   MCV 89.4 08/01/2016   PLT 176 08/01/2016    Recent Labs Lab 08/01/16 1929 08/01/16 1937  NA 139 140  K 4.1 4.0  CL 106 103  CO2 26  --   BUN 10 12  CREATININE 0.97 0.90  CALCIUM 9.6  --   PROT 7.9  --   BILITOT 1.4*  --   ALKPHOS 53  --   ALT 14  --   AST 23  --   GLUCOSE 83 81   No results found for: CHOL, HDL, LDLCALC, TRIG No results found for: [facility]   Radiology Studies    Dg Chest 2 View  Result Date: 08/01/2016 CLINICAL DATA:  Headache, syncope. EXAM: CHEST  2 VIEW COMPARISON:  None. FINDINGS: Left-sided pacemaker with leads projecting over the right atrium and ventricle. We from prior abdominal pacemaker also in place.The cardiomediastinal contours are normal. The lungs are clear. Pulmonary vasculature is normal. No consolidation, pleural effusion, or pneumothorax. No acute osseous abnormalities are seen. IMPRESSION: No active cardiopulmonary disease. Pacemaker in place. Electronically Signed   By: Rubye OaksMelanie  Ehinger M.D.   On: 08/01/2016 20:49   Ct Head Wo Contrast  Result Date: 08/01/2016 CLINICAL DATA:  Dizzy. Syncopal episode while at home alone. Headache. EXAM: CT HEAD WITHOUT CONTRAST TECHNIQUE: Contiguous axial images were obtained from the base of the skull through the vertex without intravenous contrast. COMPARISON:  None. FINDINGS: Brain: No acute infarct, hemorrhage, or mass lesion is present. The ventricles are of normal size. No significant extraaxial fluid collection is present. No significant white matter disease is present. The brainstem and cerebellum are normal. Vascular: No hyperdense vessel or unexpected calcification. Skull: The calvarium is within normal limits. No focal  lytic or blastic lesions are present. Sinuses/Orbits: The paranasal sinuses and mastoid air cells are clear. The globes and orbits are intact. Other: IMPRESSION: Negative CT of the head. Electronically Signed   By: Marin Robertshristopher  Mattern M.D.   On: 08/01/2016 20:42    ECG & Cardiac Imaging    EKG: SR Vpaced  Echo: Pending  Assessment & Plan    20 yo female with PMH of congenital heart block s/p PPM ([employer]tr[employer]), and DVT who presented with syncope.  1. Syncope: Reports two different episodes yesterday while sitting. Developed a headache prior to syncope. No reported seizure like activity. No further episodes this admission. Denies any hx of arrhythmias. UDS was negative.  -- EEG done today with results pending. Telemetry shows SR with v pacing.  -- Pacemaker was interrogated in the ED, but results not in the chart.  Follow up interrogation with no acute findings noted.  -- Echo done but reading is pending.   2. Hx of DVT: on chronic coumadin, INR was subtherapeutic on admission though she reports being compliant with medications.   Janice Coffin, NP-C Pager [phone] 08/02/2016, 3:25 PM   The patient was seen, examined and discussed with Laverda Page, NP-C and I agree with the above.   A very pleasant 20 year old student with congenital heart block with PM implanted shortly after birth who presented after an episode of syncope yesterday while studying. She developed sudden onset sharp frontal headache followed by a syncope, no preceding chest pain, SOB or palpitations. Interrogation of her PM showed no pauses or tachycardia. EEG results are pending. I have reviewed her echocardiogram, LVEF is normal, RV is mildly dilated with RVSP 43 mmHg. We will check D dime, her INR has been < 2. On chronic coumadin for h/o DVT.   Tobias Alexander, MD 08/02/2016

## 2016-08-02 NOTE — Progress Notes (Signed)
EEG Completed; Results Pending  

## 2016-08-02 NOTE — Progress Notes (Signed)
  Echocardiogram 2D Echocardiogram has been performed.  Elizabeth SavoyCasey Ballard Elizabeth Ballard 08/02/2016, 3:05 PM

## 2016-08-02 NOTE — Progress Notes (Signed)
Attempted to receive report x2.  Minerva Endsiffany N Esthefany Herrig RN

## 2016-08-03 DIAGNOSIS — Z950 Presence of cardiac pacemaker: Secondary | ICD-10-CM | POA: Diagnosis not present

## 2016-08-03 DIAGNOSIS — Z8673 Personal history of transient ischemic attack (TIA), and cerebral infarction without residual deficits: Secondary | ICD-10-CM | POA: Diagnosis not present

## 2016-08-03 DIAGNOSIS — Z7901 Long term (current) use of anticoagulants: Secondary | ICD-10-CM | POA: Diagnosis not present

## 2016-08-03 DIAGNOSIS — R51 Headache: Secondary | ICD-10-CM | POA: Diagnosis present

## 2016-08-03 DIAGNOSIS — R55 Syncope and collapse: Secondary | ICD-10-CM | POA: Diagnosis not present

## 2016-08-03 DIAGNOSIS — Z86718 Personal history of other venous thrombosis and embolism: Secondary | ICD-10-CM | POA: Diagnosis not present

## 2016-08-03 DIAGNOSIS — R4781 Slurred speech: Secondary | ICD-10-CM | POA: Diagnosis present

## 2016-08-03 DIAGNOSIS — R791 Abnormal coagulation profile: Secondary | ICD-10-CM | POA: Diagnosis present

## 2016-08-03 DIAGNOSIS — Z79899 Other long term (current) drug therapy: Secondary | ICD-10-CM | POA: Diagnosis not present

## 2016-08-03 DIAGNOSIS — Q246 Congenital heart block: Secondary | ICD-10-CM | POA: Diagnosis not present

## 2016-08-03 LAB — GLUCOSE, CAPILLARY: GLUCOSE-CAPILLARY: 97 mg/dL (ref 65–99)

## 2016-08-03 LAB — PROTIME-INR
INR: 1.49
PROTHROMBIN TIME: 18.1 s — AB (ref 11.4–15.2)

## 2016-08-03 MED ORDER — WARFARIN SODIUM 4 MG PO TABS
8.0000 mg | ORAL_TABLET | Freq: Once | ORAL | Status: AC
  Administered 2016-08-03: 8 mg via ORAL
  Filled 2016-08-03: qty 2

## 2016-08-03 NOTE — Care Management Note (Signed)
Case Management Note Donn PieriniKristi Ashrith Sagan RN, BSN Unit 2W-Case Manager [phone]  Patient Details  Name: Elizabeth Ballard MRN: [id number] Date of Birth: 10/06/1995  Subjective/Objective:   Pt admitted with snycope                 Action/Plan: PTA pt lived at home home- is here going to school- from [place]- referral for PCP needs received- spoke with pt at bedside- per conversation pt states that she has PCP and doctors in [place]- started school here at [employer] in Aug.- plans to be here for next several years while she is in school and needs a PCP here to follow her INR. - confirmed address in epic and phone #- per Dr. Arbutus Leasat- cardiology unable to follow INR at coumadin clinic- call made to [facility]- first available was Jan.- [facility] and was able [facility] get an [facility] for T[facility]12[facility]- pt given info on the clinic along with appointment time- info also on AVS.   Expected Discharge Date:    08/04/16              Expected Discharge Plan:  Home/Self Care  In-House Referral:     Discharge planning Services  CM Consult, Follow-up appt scheduled  Post Acute Care Choice:    Choice offered to:     DME Arranged:    DME Agency:     HH Arranged:    HH Agency:     Status of Service:  Completed, signed off  If discussed at [employer] Length of Stay Meetings, dates discussed:    Additional Comments:  Darrold SpanWebster, Mychal Durio Hall, RN 08/03/2016, 2:44 PM

## 2016-08-03 NOTE — Progress Notes (Signed)
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 The patient is rather nondescript when asked regarding the details regarding her syncopal episodes. In fact, the patient's cousin has concerns that her episodes may have been related to some anxiety. Nevertheless, patient states that she was sitting on the couch when she had her first syncopal episode without aura. There was no tongue bite, urinary or bowel incontinence. Apparently, the patient called her mother and her mom felt that the patient had some slurred speech. Within a few minutes, while the patient was still sitting down she had another syncopal episode. [place] and states that she has not followed up with her previous cardiologist for several months. She is here attending community college. Furthermore, she has not had her INR checked in several months, but she endorses compliance with her warfarin. Orthostatics were negative. The patient is afebrile and hemodynamically stable.  Assessment/Plan: Syncope -in the setting of congenital heart block and PPM, request cardiology evaluation -echo--EF 55-60 %, PASP 33, mod TR -D-Dimer negative -orthostatics negative -EEG--neg -UDS--neg -monitor on tele--no concerning dysrhythmia  Congenital Heart Block -appreciate cardiology consult - Interrogation of her PM showed no pauses or tachycardia -no further work up  Hx DVT -continue warfarin -PharmD to assist with dosing  Chronic daily Headache -CT brain negative -improved    Disposition Plan:   Hopeful home when INR trending up Family Communication:   No Family at bedside--Total time spent 35 minutes.  Greater than 50% spent face to face counseling and  coordinating care.   Consultants:  Cardiology  Code Status:  FULL  DVT Prophylaxis:  warfarin   Procedures: As Listed in Progress Note Above  Antibiotics: None    Subjective: Patient denies fevers, chills, headache, chest pain, dyspnea, nausea, vomiting, diarrhea, abdominal pain, dysuria, hematuria, hematochezia, and melena.   Objective: Vitals:   08/02/16 1611 08/02/16 1846 08/02/16 2025 08/03/16 0445  BP: (!) 102/58 110/72 117/68 (!) 97/54  Pulse: 60 62 64 64  Resp: 15 18 18 16   Temp: 98.5 F (36.9 C) 98.1 F (36.7 C) 97.9 F (36.6 C) 97.7 F (36.5 C)  TempSrc: Oral Oral Oral Oral  SpO2: 100% 90% 100% 100%  Weight:  70.6 kg (155 lb 9.6 oz)  70.2 kg (154 lb 11.2 oz)  Height:  5\' 3"  (1.6 m)      Intake/Output Summary (Last 24 hours) at 08/03/16 1737 Last data filed at 08/03/16 1300  Gross per 24 hour  Intake              960 ml  Output                0 ml  Net              960 ml   Weight change: -0.008 kg (-0.3 oz) Exam:   General:  Pt is alert, follows commands appropriately, not in acute distress  HEENT: No icterus, No thrush, No neck mass, [place]/AT  Cardiovascular: RRR, S1/S2, no rubs, no gallops  Respiratory: CTA bilaterally, no wheezing, no crackles, no rhonchi  Abdomen: Soft/+BS, non tender, non distended, no guarding  Extremities: No edema, No lymphangitis, No petechiae, No rashes, no synovitis   Data Reviewed: I have  personally reviewed following labs and imaging studies Basic Metabolic Panel:  Recent Labs Lab 08/01/16 1929 08/01/16 1937  NA 139 140  K 4.1 4.0  CL 106 103  CO2 26  --   GLUCOSE 83 81  BUN 10 12  CREATININE 0.97 0.90  CALCIUM 9.6  --    Liver Function Tests:  Recent Labs Lab 08/01/16 1929  AST 23  ALT 14  ALKPHOS 53  BILITOT 1.4*  PROT 7.9  ALBUMIN 4.3   No results for input(s): LIPASE, AMYLASE in the last 168 hours. No results for input(s): AMMONIA in the last 168 hours. Coagulation  Profile:  Recent Labs Lab 08/01/16 1929 08/02/16 0159 08/03/16 0220  INR 1.77 1.91 1.49   CBC:  Recent Labs Lab 08/01/16 1929 08/01/16 1937  WBC 5.2  --   NEUTROABS 3.2  --   HGB 14.0 14.6  HCT 39.5 43.0  MCV 89.4  --   PLT 176  --    Cardiac Enzymes: No results for input(s): CKTOTAL, CKMB, CKMBINDEX, TROPONINI in the last 168 hours. BNP: Invalid input(s): POCBNP CBG:  Recent Labs Lab 08/01/16 1950 08/03/16 0449  GLUCAP 89 97   HbA1C: No results for input(s): HGBA1C in the last 72 hours. Urine analysis:    Component Value Date/Time   COLORURINE YELLOW 08/01/2016 1858   APPEARANCEUR CLEAR 08/01/2016 1858   LABSPEC 1.015 08/01/2016 1858   PHURINE 6.5 08/01/2016 1858   GLUCOSEU NEGATIVE 08/01/2016 1858   HGBUR NEGATIVE 08/01/2016 1858   BILIRUBINUR NEGATIVE 08/01/2016 1858   KETONESUR NEGATIVE 08/01/2016 1858   PROTEINUR NEGATIVE 08/01/2016 1858   NITRITE NEGATIVE 08/01/2016 1858   LEUKOCYTESUR TRACE (A) 08/01/2016 1858   Sepsis Labs: @LABRCNTIP (procalcitonin:4,lacticidven:4) )No results found for this or any previous visit (from the past 240 hour(s)).   Scheduled Meds: . sodium chloride flush  3 mL Intravenous Q12H  . sodium chloride flush  3 mL Intravenous Q12H  . Warfarin - Pharmacist Dosing Inpatient   Does not apply q1800   Continuous Infusions:  Procedures/Studies: Dg Chest 2 View  Result Date: 08/01/2016 CLINICAL DATA:  Headache, syncope. EXAM: CHEST  2 VIEW COMPARISON:  None. FINDINGS: Left-sided pacemaker with leads projecting over the right atrium and ventricle. We from prior abdominal pacemaker also in place.The cardiomediastinal contours are normal. The lungs are clear. Pulmonary vasculature is normal. No consolidation, pleural effusion, or pneumothorax. No acute osseous abnormalities are seen. IMPRESSION: No active cardiopulmonary disease. Pacemaker in place. Electronically Signed   By: Rubye OaksMelanie  Ehinger M.D.   On: 08/01/2016 20:49   Ct Head  Wo Contrast  Result Date: 08/01/2016 CLINICAL DATA:  Dizzy. Syncopal episode while at home alone. Headache. EXAM: CT HEAD WITHOUT CONTRAST TECHNIQUE: Contiguous axial images were obtained from the base of the skull through the vertex without intravenous contrast. COMPARISON:  None. FINDINGS: Brain: No acute infarct, hemorrhage, or mass lesion is present. The ventricles are of normal size. No significant extraaxial fluid collection is present. No significant white matter disease is present. The brainstem and cerebellum are normal. Vascular: No hyperdense vessel or unexpected calcification. Skull: The calvarium is within normal limits. No focal lytic or blastic lesions are present. Sinuses/Orbits: The paranasal sinuses and mastoid air cells are clear. The globes and orbits are intact. Other: IMPRESSION: Negative CT of the head. Electronically Signed   By: Marin Robertshristopher  Mattern M.D.   On: 08/01/2016 20:42    Adore Kithcart, DO  [facility] Pager [phone]  If 7PM-7AM, please contact night-coverage [email or web address] Password  [email or web address] 08/03/2016, 5:37 PM   LOS: 0 days

## 2016-08-03 NOTE — Hospital Discharge Follow-Up (Signed)
[facility]:  This Case Manager spoke with Donn PieriniKristi Webster, RN CM who indicated patient needing hospital follow-up appointment and INR check on 08/06/16 or 08/07/16. Hospital follow-up appointment obtained on 08/07/16 at 3:00 pm with [name], PA. AVS updated. Donn PieriniKristi Webster, RN CM to update patient. No additional needs identified.

## 2016-08-03 NOTE — Progress Notes (Signed)
ANTICOAGULATION CONSULT NOTE - Follow Up Consult  Pharmacy Consult for Coumadin Indication: h/o DVT  No Known Allergies  Patient Measurements: Height: 5\' 3"  (160 cm) Weight: 154 lb 11.2 oz (70.2 kg) IBW/kg (Calculated) : 52.4  Vital Signs: Temp: 97.7 F (36.5 C) (12/08 0445) Temp Source: Oral (12/08 0445) BP: 97/54 (12/08 0445) Pulse Rate: 64 (12/08 0445)  Labs:  Recent Labs  08/01/16 1929 08/01/16 1937 08/02/16 0159 08/03/16 0220  HGB 14.0 14.6  --   --   HCT 39.5 43.0  --   --   PLT 176  --   --   --   LABPROT 20.8*  --  22.2* 18.1*  INR 1.77  --  1.91 1.49  CREATININE 0.97 0.90  --   --     Estimated Creatinine Clearance: 94.4 mL/min (by C-G formula based on SCr of 0.9 mg/dL).  Assessment: 20yof continues on coumadin for h/o DVT. INR has dropped from 1.91 to 1.49 on her home dose of 6mg  daily. Will increase today's dose.  Goal of Therapy:  INR 2-3 Monitor platelets by anticoagulation protocol: Yes   Plan:  1) Coumadin 8mg  x 1 2) Daily INR  Elizabeth Ballard, Elizabeth Ballard 08/03/2016,10:20 AM

## 2016-08-03 NOTE — Progress Notes (Signed)
Patient Name: Elizabeth Ballard Date of Encounter: 08/03/2016  Principal Problem:   Syncope Active Problems:   History of DVT (deep vein thrombosis)   Congenital heart block   Pacemaker   Chronic headaches   Subtherapeutic international normalized ratio (INR)   Syncope and collapse   Nonintractable headache   Length of Stay: 0  SUBJECTIVE  She feels well today.   CURRENT MEDS . sodium chloride flush  3 mL Intravenous Q12H  . sodium chloride flush  3 mL Intravenous Q12H  . warfarin  8 mg Oral ONCE-1800  . Warfarin - Pharmacist Dosing Inpatient   Does not apply q1800    OBJECTIVE  Vitals:   08/02/16 1611 08/02/16 1846 08/02/16 2025 08/03/16 0445  BP: (!) 102/58 110/72 117/68 (!) 97/54  Pulse: 60 62 64 64  Resp: 15 18 18 16   Temp: 98.5 F (36.9 C) 98.1 F (36.7 C) 97.9 F (36.6 C) 97.7 F (36.5 C)  TempSrc: Oral Oral Oral Oral  SpO2: 100% 90% 100% 100%  Weight:  155 lb 9.6 oz (70.6 kg)  154 lb 11.2 oz (70.2 kg)  Height:  5\' 3"  (1.6 m)      Intake/Output Summary (Last 24 hours) at 08/03/16 1255 Last data filed at 08/02/16 2013  Gross per 24 hour  Intake              960 ml  Output                0 ml  Net              960 ml   Filed Weights   08/02/16 1006 08/02/16 1846 08/03/16 0445  Weight: 154 lb 15.7 oz (70.3 kg) 155 lb 9.6 oz (70.6 kg) 154 lb 11.2 oz (70.2 kg)    PHYSICAL EXAM  General: Pleasant, NAD. Neuro: Alert and oriented X 3. Moves all extremities spontaneously. Psych: Normal affect. HEENT:  Normal  Neck: Supple without bruits or JVD. Lungs:  Resp regular and unlabored, CTA. Heart: RRR no s3, s4, or murmurs. Abdomen: Soft, non-tender, non-distended, BS + x 4.  Extremities: No clubbing, cyanosis or edema. DP/PT/Radials 2+ and equal bilaterally.  Accessory Clinical Findings  CBC  Recent Labs  08/01/16 1929 08/01/16 1937  WBC 5.2  --   NEUTROABS 3.2  --   HGB 14.0 14.6  HCT 39.5 43.0  MCV 89.4  --   PLT 176  --    Basic  Metabolic Panel  Recent Labs  08/01/16 1929 08/01/16 1937  NA 139 140  K 4.1 4.0  CL 106 103  CO2 26  --   GLUCOSE 83 81  BUN 10 12  CREATININE 0.97 0.90  CALCIUM 9.6  --    Liver Function Tests  Recent Labs  08/01/16 1929  AST 23  ALT 14  ALKPHOS 53  BILITOT 1.4*  PROT 7.9  ALBUMIN 4.3   No results for input(s): LIPASE, AMYLASE in the last 72 hours. Cardiac Enzymes No results for input(s): CKTOTAL, CKMB, CKMBINDEX, TROPONINI in the last 72 hours. BNP Invalid input(s): POCBNP D-Dimer  Recent Labs  08/02/16 1634  DDIMER <0.27   Hemoglobin A1C No results for input(s): HGBA1C in the last 72 hours. Fasting Lipid Panel No results for input(s): CHOL, HDL, LDLCALC, TRIG, CHOLHDL, LDLDIRECT in the last 72 hours. Thyroid Function Tests No results for input(s): TSH, T4TOTAL, T3FREE, THYROIDAB in the last 72 hours.  Invalid input(s): FREET3  Radiology/Studies  Dg Chest 2  View  Result Date: 08/01/2016 CLINICAL DATA:  Headache, syncope. EXAM: CHEST  2 VIEW COMPARISON:  None. FINDINGS: Left-sided pacemaker with leads projecting over the right atrium and ventricle. We from prior abdominal pacemaker also in place.The cardiomediastinal contours are normal. The lungs are clear. Pulmonary vasculature is normal. No consolidation, pleural effusion, or pneumothorax. No acute osseous abnormalities are seen. IMPRESSION: No active cardiopulmonary disease. Pacemaker in place. Electronically Signed   By: Rubye OaksMelanie  Ehinger M.D.   On: 08/01/2016 20:49   Ct Head Wo Contrast  Result Date: 08/01/2016 CLINICAL DATA:  Dizzy. Syncopal episode while at home alone. Headache. EXAM: CT HEAD WITHOUT CONTRAST TECHNIQUE: Contiguous axial images were obtained from the base of the skull through the vertex without intravenous contrast. COMPARISON:  None. FINDINGS: Brain: No acute infarct, hemorrhage, or mass lesion is present. The ventricles are of normal size. No significant extraaxial fluid collection is  present. No significant white matter disease is present. The brainstem and cerebellum are normal. Vascular: No hyperdense vessel or unexpected calcification. Skull: The calvarium is within normal limits. No focal lytic or blastic lesions are present. Sinuses/Orbits: The paranasal sinuses and mastoid air cells are clear. The globes and orbits are intact. Other: IMPRESSION: Negative CT of the head. Electronically Signed   By: Marin Robertshristopher  Mattern M.D.   On: 08/01/2016 20:42    TELE: A-V sequential pacing.     ASSESSMENT AND PLAN  1. Syncope:  2. Hx of DVT: on chronic coumadin  A very pleasant 20 year old student with congenital heart block with PM implanted shortly after birth who presented after an episode of syncope yesterday while studying. She developed sudden onset sharp frontal headache followed by a syncope, no preceding chest pain, SOB or palpitations. Interrogation of her PM showed no pauses or tachycardia. EEG results are pending. I have reviewed her echocardiogram, LVEF is normal, RV is mildly dilated with RVSP 43 mmHg.  D dimer was negative, PM interrogation normal. EEG normal.  She can be discharged from cardiac standpoint.   Signed, Tobias AlexanderKatarina Cayleb Jarnigan MD, [facility] 08/03/2016

## 2016-08-04 LAB — GLUCOSE, CAPILLARY: GLUCOSE-CAPILLARY: 120 mg/dL — AB (ref 65–99)

## 2016-08-04 LAB — PROTIME-INR
INR: 1.45
Prothrombin Time: 17.7 seconds — ABNORMAL HIGH (ref 11.4–15.2)

## 2016-08-04 MED ORDER — WARFARIN SODIUM 4 MG PO TABS
8.0000 mg | ORAL_TABLET | Freq: Once | ORAL | Status: AC
  Administered 2016-08-04: 8 mg via ORAL
  Filled 2016-08-04: qty 2

## 2016-08-04 MED ORDER — ENOXAPARIN SODIUM 80 MG/0.8ML ~~LOC~~ SOLN
70.0000 mg | Freq: Two times a day (BID) | SUBCUTANEOUS | Status: DC
  Administered 2016-08-04 – 2016-08-06 (×5): 70 mg via SUBCUTANEOUS
  Filled 2016-08-04 (×5): qty 0.8

## 2016-08-04 NOTE — Progress Notes (Signed)
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 The patient is rather nondescript when asked regarding the details regarding her syncopal episodes. In fact, the patient's cousin has concerns that her episodes may have been related to some anxiety. Nevertheless, patient states that she was sitting on the couch when she had her first syncopal episode without aura. There was no tongue bite, urinary or bowel incontinence. Apparently, the patient called her mother and her mom felt that the patient had some slurred speech. Within a few minutes, while the patient was still sitting down she had another syncopal episode. [place] and states that she has not followed up with her previous cardiologist for several months. She is here attending community college. Furthermore, she has not had her INR checked in several months, but she endorses compliance with her warfarin. Orthostatics were negative. The patient is afebrile and hemodynamically stable.  Assessment/Plan: Syncope -in the setting of congenital heart block and PPM, request cardiology evaluation -echo--EF 55-60 %, PASP 33, mod TR -D-Dimer negative -orthostatics negative -EEG--neg -UDS--neg -monitor on tele--no concerning dysrhythmia  Congenital Heart Block -appreciate cardiology consult -Interrogation of her PM showed no pauses or tachycardia -no further work up  Hx DVT -continue warfarin -PharmD to assist with dosing -12/9 with INR still subtherapeutic, start [place] Lovenox bridge  Chronic daily Headache -CT brain negative -improved    Disposition Plan: Hopeful home when INR trending up Family Communication: No Family at bedside   Consultants:  Cardiology  Code Status: FULL  DVT Prophylaxis: warfarin   Procedures: As Listed in Progress Note Above  Antibiotics: None   Subjective: Patient denies fevers, chills, headache, chest pain, dyspnea, nausea, vomiting, diarrhea, abdominal pain, dysuria, hematuria, hematochezia, and melena.   Objective: Vitals:   08/03/16 0445 08/03/16 2027 08/04/16 0526 08/04/16 1305  BP: (!) 97/54 108/74 104/72 112/75  Pulse: 64 81 (!) 101 100  Resp: 16 18 18 16   Temp: 97.7 F (36.5 C) 98.1 F (36.7 C) 97.4 F (36.3 C) 98.5 F (36.9 C)  TempSrc: Oral Oral Oral Oral  SpO2: 100% 100% 100% 100%  Weight: 70.2 kg (154 lb 11.2 oz)  70.9 kg (156 lb 4.8 oz)   Height:        Intake/Output Summary (Last 24 hours) at 08/04/16 1622 Last data filed at 08/04/16 1200  Gross per 24 hour  Intake             1200 ml  Output                0 ml  Net             1200 ml   Weight change: 0.597 kg (1 lb 5.1 oz) Exam:   General:  Pt is alert, follows commands appropriately, not in acute distress  HEENT: No icterus, No thrush, No neck mass, [place]/AT  Cardiovascular: RRR, S1/S2, no rubs, no gallops  Respiratory: CTA bilaterally, no wheezing, no crackles, no rhonchi  Abdomen: Soft/+BS, non tender, non distended, no guarding  Extremities: No edema, No lymphangitis, No petechiae, No rashes, no synovitis   Data Reviewed: I have personally reviewed following labs and imaging studies Basic Metabolic Panel:  Recent Labs Lab 08/01/16 1929 08/01/16 1937  NA  139 140  K 4.1 4.0  CL 106 103  CO2 26  --   GLUCOSE 83 81  BUN 10 12  CREATININE 0.97 0.90  CALCIUM 9.6  --    Liver Function Tests:  Recent Labs Lab 08/01/16 1929  AST 23  ALT 14  ALKPHOS 53  BILITOT 1.4*  PROT 7.9  ALBUMIN 4.3   No results for input(s): LIPASE, AMYLASE in the last 168 hours. No results for input(s): AMMONIA in the last 168 hours. Coagulation Profile:  Recent Labs Lab 08/01/16 1929 08/02/16 0159  08/03/16 0220 08/04/16 0245  INR 1.77 1.91 1.49 1.45   CBC:  Recent Labs Lab 08/01/16 1929 08/01/16 1937  WBC 5.2  --   NEUTROABS 3.2  --   HGB 14.0 14.6  HCT 39.5 43.0  MCV 89.4  --   PLT 176  --    Cardiac Enzymes: No results for input(s): CKTOTAL, CKMB, CKMBINDEX, TROPONINI in the last 168 hours. BNP: Invalid input(s): POCBNP CBG:  Recent Labs Lab 08/01/16 1950 08/03/16 0449 08/04/16 0644  GLUCAP 89 97 120*   HbA1C: No results for input(s): HGBA1C in the last 72 hours. Urine analysis:    Component Value Date/Time   COLORURINE YELLOW 08/01/2016 1858   APPEARANCEUR CLEAR 08/01/2016 1858   LABSPEC 1.015 08/01/2016 1858   PHURINE 6.5 08/01/2016 1858   GLUCOSEU NEGATIVE 08/01/2016 1858   HGBUR NEGATIVE 08/01/2016 1858   BILIRUBINUR NEGATIVE 08/01/2016 1858   KETONESUR NEGATIVE 08/01/2016 1858   PROTEINUR NEGATIVE 08/01/2016 1858   NITRITE NEGATIVE 08/01/2016 1858   LEUKOCYTESUR TRACE (A) 08/01/2016 1858   Sepsis Labs: @LABRCNTIP (procalcitonin:4,lacticidven:4) )No results found for this or any previous visit (from the past 240 hour(s)).   Scheduled Meds: . enoxaparin (LOVENOX) injection  70 mg Subcutaneous BID  . sodium chloride flush  3 mL Intravenous Q12H  . sodium chloride flush  3 mL Intravenous Q12H  . warfarin  8 mg Oral ONCE-1800  . Warfarin - Pharmacist Dosing Inpatient   Does not apply q1800   Continuous Infusions:  Procedures/Studies: Dg Chest 2 View  Result Date: 08/01/2016 CLINICAL DATA:  Headache, syncope. EXAM: CHEST  2 VIEW COMPARISON:  None. FINDINGS: Left-sided pacemaker with leads projecting over the right atrium and ventricle. We from prior abdominal pacemaker also in place.The cardiomediastinal contours are normal. The lungs are clear. Pulmonary vasculature is normal. No consolidation, pleural effusion, or pneumothorax. No acute osseous abnormalities are seen. IMPRESSION: No active cardiopulmonary disease. Pacemaker in place.  Electronically Signed   By: Rubye Oaks M.D.   On: 08/01/2016 20:49   Ct Head Wo Contrast  Result Date: 08/01/2016 CLINICAL DATA:  Dizzy. Syncopal episode while at home alone. Headache. EXAM: CT HEAD WITHOUT CONTRAST TECHNIQUE: Contiguous axial images were obtained from the base of the skull through the vertex without intravenous contrast. COMPARISON:  None. FINDINGS: Brain: No acute infarct, hemorrhage, or mass lesion is present. The ventricles are of normal size. No significant extraaxial fluid collection is present. No significant white matter disease is present. The brainstem and cerebellum are normal. Vascular: No hyperdense vessel or unexpected calcification. Skull: The calvarium is within normal limits. No focal lytic or blastic lesions are present. Sinuses/Orbits: The paranasal sinuses and mastoid air cells are clear. The globes and orbits are intact. Other: IMPRESSION: Negative CT of the head. Electronically Signed   By: Marin Roberts M.D.   On: 08/01/2016 20:42    Giancarlo Askren, DO  [facility] Pager [phone]  If 7PM-7AM, please  contact night-coverage [email or web address] Password [email or web address] 08/04/2016, 4:22 PM   LOS: 1 day

## 2016-08-04 NOTE — Progress Notes (Signed)
Patient lying in bed, no needs at this time. Call light within reach. 

## 2016-08-04 NOTE — Progress Notes (Signed)
ANTICOAGULATION CONSULT NOTE - Follow Up Consult  Pharmacy Consult for Coumadin and Lovenox Indication: h/o DVT  No Known Allergies  Patient Measurements: Height: 5\' 3"  (160 cm) Weight: 156 lb 4.8 oz (70.9 kg) IBW/kg (Calculated) : 52.4  Vital Signs: Temp: 97.4 F (36.3 C) (12/09 0526) Temp Source: Oral (12/09 0526) BP: 104/72 (12/09 0526) Pulse Rate: 101 (12/09 0526)  Labs:  Recent Labs  08/01/16 1929 08/01/16 1937 08/02/16 0159 08/03/16 0220 08/04/16 0245  HGB 14.0 14.6  --   --   --   HCT 39.5 43.0  --   --   --   PLT 176  --   --   --   --   LABPROT 20.8*  --  22.2* 18.1* 17.7*  INR 1.77  --  1.91 1.49 1.45  CREATININE 0.97 0.90  --   --   --     Estimated Creatinine Clearance: 94.9 mL/min (by C-G formula based on SCr of 0.9 mg/dL).  Assessment: 20yof continues on coumadin for h/o DVT. INR remains below goal at 1.45. Lovenox bridge will be added. Weight 71kg, CrCl 5095ml/min. CBC stable.  Home dose: 6mg  daily  Goal of Therapy:  INR 2-3 Monitor platelets by anticoagulation protocol: Yes   Plan:  1) Coumadin 8mg  x 1 again 2) Lovenox 70mg  sq q12 3) Daily INR, CBC q72h  Fredrik RiggerMarkle, Lacrisha Bielicki Sue 08/04/2016,12:11 PM

## 2016-08-05 LAB — PROTIME-INR
INR: 1.86
Prothrombin Time: 21.7 seconds — ABNORMAL HIGH (ref 11.4–15.2)

## 2016-08-05 LAB — GLUCOSE, CAPILLARY: GLUCOSE-CAPILLARY: 102 mg/dL — AB (ref 65–99)

## 2016-08-05 MED ORDER — WARFARIN SODIUM 3 MG PO TABS
6.0000 mg | ORAL_TABLET | Freq: Once | ORAL | Status: AC
  Administered 2016-08-05: 6 mg via ORAL
  Filled 2016-08-05: qty 2

## 2016-08-05 NOTE — Progress Notes (Signed)
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 The patient is rather nondescript when asked regarding the details regarding her syncopal episodes. In fact, the patient's cousin has concerns that her episodes may have been related to some anxiety. Nevertheless, patient states that she was sitting on the couch when she had her first syncopal episode without aura. There was no tongue bite, urinary or bowel incontinence. Apparently, the patient called her mother and her mom felt that the patient had some slurred speech. Within a few minutes, while the patient was still sitting down she had another syncopal episode. [place] and states that she has not followed up with her previous cardiologist for several months. She is here attending community college. Furthermore, she has not had her INR checked in several months, but she endorses compliance with her warfarin. Orthostatics were negative. The patient is afebrile and hemodynamically stable.  Assessment/Plan: Syncope -in the setting of congenital heart block and PPM, request cardiology evaluation -echo--EF 55-60 %, PASP 33, mod TR -D-Dimer negative -orthostatics negative -EEG--neg -UDS--neg -monitor on tele--no concerning dysrhythmia  Congenital Heart Block -appreciate cardiology consult -Interrogation of her PM showed no pauses or tachycardia -no further work up  Hx DVT -continue warfarin -PharmD to assist with dosing -12/9 with INR still subtherapeutic, start [place] Lovenox bridge  Chronic daily Headache -CT brain negative -improved    Disposition Plan: Home 08/06/16 Family Communication: No Family at bedside   Consultants: Cardiology  Code Status:  FULL  DVT Prophylaxis: warfarin   Procedures: As Listed in Progress Note Above  Antibiotics: None    Subjective: Patient denies fevers, chills, headache, chest pain, dyspnea, nausea, vomiting, diarrhea, abdominal pain, dysuria, hematuria, hematochezia, and melena.   Objective: Vitals:   08/04/16 1305 08/04/16 1955 08/05/16 0557 08/05/16 1252  BP: 112/75 112/73 112/76 114/72  Pulse: 100 79 66 72  Resp: 16 16 17 16   Temp: 98.5 F (36.9 C) 98.6 F (37 C) 98.1 F (36.7 C) 97.3 F (36.3 C)  TempSrc: Oral Oral Oral Oral  SpO2: 100% 100% 100% 100%  Weight:      Height:        Intake/Output Summary (Last 24 hours) at 08/05/16 1731 Last data filed at 08/05/16 1700  Gross per 24 hour  Intake              720 ml  Output                0 ml  Net              720 ml   Weight change:  Exam:   General:  Pt is alert, follows commands appropriately, not in acute distress  HEENT: No icterus, No thrush, No neck mass, [place]/AT  Cardiovascular: RRR, S1/S2, no rubs, no gallops  Respiratory: CTA bilaterally, no wheezing, no crackles, no rhonchi  Abdomen: Soft/+BS, non tender, non distended, no guarding  Extremities: No edema, No lymphangitis, No petechiae, No rashes, no synovitis   Data Reviewed: I have personally reviewed following labs and imaging studies Basic Metabolic Panel:  Recent Labs Lab 08/01/16 1929 08/01/16 1937  NA 139 140  K 4.1 4.0  CL 106 103  CO2 26  --   GLUCOSE  83 81  BUN 10 12  CREATININE 0.97 0.90  CALCIUM 9.6  --    Liver Function Tests:  Recent Labs Lab 08/01/16 1929  AST 23  ALT 14  ALKPHOS 53  BILITOT 1.4*  PROT 7.9  ALBUMIN 4.3   No results for input(s): LIPASE, AMYLASE in the last 168 hours. No results for input(s): AMMONIA in the last 168 hours. Coagulation Profile:  Recent Labs Lab 08/01/16 1929 08/02/16 0159 08/03/16 0220 08/04/16 0245 08/05/16 0209  INR 1.77 1.91 1.49 1.45 1.86   CBC:  Recent Labs Lab  08/01/16 1929 08/01/16 1937  WBC 5.2  --   NEUTROABS 3.2  --   HGB 14.0 14.6  HCT 39.5 43.0  MCV 89.4  --   PLT 176  --    Cardiac Enzymes: No results for input(s): CKTOTAL, CKMB, CKMBINDEX, TROPONINI in the last 168 hours. BNP: Invalid input(s): POCBNP CBG:  Recent Labs Lab 08/01/16 1950 08/03/16 0449 08/04/16 0644 08/05/16 0626  GLUCAP 89 97 120* 102*   HbA1C: No results for input(s): HGBA1C in the last 72 hours. Urine analysis:    Component Value Date/Time   COLORURINE YELLOW 08/01/2016 1858   APPEARANCEUR CLEAR 08/01/2016 1858   LABSPEC 1.015 08/01/2016 1858   PHURINE 6.5 08/01/2016 1858   GLUCOSEU NEGATIVE 08/01/2016 1858   HGBUR NEGATIVE 08/01/2016 1858   BILIRUBINUR NEGATIVE 08/01/2016 1858   KETONESUR NEGATIVE 08/01/2016 1858   PROTEINUR NEGATIVE 08/01/2016 1858   NITRITE NEGATIVE 08/01/2016 1858   LEUKOCYTESUR TRACE (A) 08/01/2016 1858   Sepsis Labs: @LABRCNTIP (procalcitonin:4,lacticidven:4) )No results found for this or any previous visit (from the past 240 hour(s)).   Scheduled Meds: . enoxaparin (LOVENOX) injection  70 mg Subcutaneous BID  . sodium chloride flush  3 mL Intravenous Q12H  . sodium chloride flush  3 mL Intravenous Q12H  . warfarin  6 mg Oral ONCE-1800  . Warfarin - Pharmacist Dosing Inpatient   Does not apply q1800   Continuous Infusions:  Procedures/Studies: Dg Chest 2 View  Result Date: 08/01/2016 CLINICAL DATA:  Headache, syncope. EXAM: CHEST  2 VIEW COMPARISON:  None. FINDINGS: Left-sided pacemaker with leads projecting over the right atrium and ventricle. We from prior abdominal pacemaker also in place.The cardiomediastinal contours are normal. The lungs are clear. Pulmonary vasculature is normal. No consolidation, pleural effusion, or pneumothorax. No acute osseous abnormalities are seen. IMPRESSION: No active cardiopulmonary disease. Pacemaker in place. Electronically Signed   By: Rubye OaksMelanie  Ehinger M.D.   On: 08/01/2016 20:49    Ct Head Wo Contrast  Result Date: 08/01/2016 CLINICAL DATA:  Dizzy. Syncopal episode while at home alone. Headache. EXAM: CT HEAD WITHOUT CONTRAST TECHNIQUE: Contiguous axial images were obtained from the base of the skull through the vertex without intravenous contrast. COMPARISON:  None. FINDINGS: Brain: No acute infarct, hemorrhage, or mass lesion is present. The ventricles are of normal size. No significant extraaxial fluid collection is present. No significant white matter disease is present. The brainstem and cerebellum are normal. Vascular: No hyperdense vessel or unexpected calcification. Skull: The calvarium is within normal limits. No focal lytic or blastic lesions are present. Sinuses/Orbits: The paranasal sinuses and mastoid air cells are clear. The globes and orbits are intact. Other: IMPRESSION: Negative CT of the head. Electronically Signed   By: Marin Robertshristopher  Mattern M.D.   On: 08/01/2016 20:42    Kamron Portee, DO  [facility] Pager [phone]  If 7PM-7AM, please contact night-coverage [email or web address] Password [email or web address] 08/05/2016, 5:31 PM   LOS: 2  days

## 2016-08-05 NOTE — Discharge Summary (Addendum)
Physician Discharge Summary  Elizabeth Ballard [name]:[id number] DOB: 10/13/1995 DOA: 08/01/2016  PCP: Pcp Not In System  Admit date: 08/01/2016 Discharge date: 08/06/16  Admitted From: home Disposition:  Home  Recommendations for Outpatient Follow-up:  1. Follow up with PCP in 1-2 weeks 2. 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 The patient is rather nondescript when asked regarding the details regarding her syncopal episodes. In fact, the patient's cousin has concerns that her episodes may have been related to some anxiety. Nevertheless, patient states that she was sitting on the couch when she had her first syncopal episode without aura. There was no tongue bite, urinary or bowel incontinence. Apparently, the patient called her mother and her mom felt that the patient had some slurred speech. Within a few minutes, while the patient was still sitting down she had another syncopal episode. [place] [facility] [place] [facility] and states that she has not followed up with her previous cardiologist for several months. She is here attending community college. Furthermore, she has not had her INR checked in several months, but she endorses compliance with her warfarin. Orthostatics were negative. The patient is afebrile and hemodynamically stable.Cardiology was consulted and her pacemaker was interrogated. There was no pauses or dysrhythmias. Because of the patient's questionable compliance with outpt followup, the decision was made to keep the patient in the hospital with Lovenox bridge until her INR was therapeutic.   Discharge Diagnoses:  Syncope -in the setting of  congenital heart block and PPM, request cardiology evaluation -echo--EF 55-60 %, PASP 33, mod TR -D-Dimer negative -orthostatics negative -EEG--neg -UDS--neg -monitor on tele--no concerning dysrhythmia  Congenital Heart Block -appreciate cardiology consult--no further work up -Interrogation of her PM showed no pauses or tachycardia -no further work up  Hx recurrent DVT -continue warfarin -PharmD to assist with dosing -12/9 with INR still subtherapeutic, started [place] Lovenox bridge -INR 1.97  on the day of discharge -[facility] at 330PM for office visit and INR check -pt received coumadin 8 mg on 12/8, 12/9, and 12/11 -she received coumadin 6 mg on 12/10 -discharge with instructions to restart Coumadin 6mg  daily startin 08/07/16 -d/c Lovenox at time of discharge  Chronic daily Headache -CT brain negative -improved    Discharge Instructions     Medication List    TAKE these medications   atenolol 25 MG tablet Commonly known as:  TENORMIN Take 25 mg by mouth daily.   warfarin 5 MG tablet Commonly known as:  COUMADIN Take 6 mg by mouth daily.   warfarin 1 MG tablet Commonly known as:  COUMADIN Take 6 mg by mouth daily.      Follow-up Information    Will [name], MD Follow up.   Specialty:  Cardiology Why:  If you need to see an electrophysiologist in [place] you can call us. Otherwise, please continue to follow with your doctors in [place]. Contact information: [address] [place] [address] [phone]         [facility] Follow up on 08/07/2016.   Why:  Hospital follow-up appointment on 08/07/16 at 3:00 pm with [name], PA.- PT/INR check with appointment Contact  information: [address] [place] [address] [phone]         No Known  Allergies  Consultations:  cardiology   Procedures/Studies: Dg Chest 2 View  Result Date: 08/01/2016 CLINICAL DATA:  Headache, syncope. EXAM: CHEST  2 VIEW COMPARISON:  None. FINDINGS: Left-sided pacemaker with leads projecting over the right atrium and ventricle. We from prior abdominal pacemaker also in place.The cardiomediastinal contours are normal. The lungs are clear. Pulmonary vasculature is normal. No consolidation, pleural effusion, or pneumothorax. No acute osseous abnormalities are seen. IMPRESSION: No active cardiopulmonary disease. Pacemaker in place. Electronically Signed   By: Rubye Oaks M.D.   On: 08/01/2016 20:49   Ct Head Wo Contrast  Result Date: 08/01/2016 CLINICAL DATA:  Dizzy. Syncopal episode while at home alone. Headache. EXAM: CT HEAD WITHOUT CONTRAST TECHNIQUE: Contiguous axial images were obtained from the base of the skull through the vertex without intravenous contrast. COMPARISON:  None. FINDINGS: Brain: No acute infarct, hemorrhage, or mass lesion is present. The ventricles are of normal size. No significant extraaxial fluid collection is present. No significant white matter disease is present. The brainstem and cerebellum are normal. Vascular: No hyperdense vessel or unexpected calcification. Skull: The calvarium is within normal limits. No focal lytic or blastic lesions are present. Sinuses/Orbits: The paranasal sinuses and mastoid air cells are clear. The globes and orbits are intact. Other: IMPRESSION: Negative CT of the head. Electronically Signed   By: Marin Roberts M.D.   On: 08/01/2016 20:42        Discharge Exam: Vitals:   08/05/16 2004 08/06/16 0610  BP: 110/72 104/66  Pulse: 86 84  Resp: 16 16  Temp: 98.3 F (36.8 C) 98.1 F (36.7 C)   Vitals:   08/05/16 0557 08/05/16 1252 08/05/16 2004 08/06/16 0610  BP: 112/76 114/72 110/72 104/66  Pulse: 66 72 86 84  Resp: 17 16 16 16   Temp: 98.1 F (36.7 C) 97.3 F (36.3 C) 98.3 F (36.8  C) 98.1 F (36.7 C)  TempSrc: Oral Oral Oral Oral  SpO2: 100% 100% 100% 100%  Weight:      Height:        General: Pt is alert, awake, not in acute distress Cardiovascular: RRR, S1/S2 +, no rubs, no gallops Respiratory: CTA bilaterally, no wheezing, no rhonchi Abdominal: Soft, NT, ND, bowel sounds + Extremities: no edema, no cyanosis   The results of significant diagnostics from this hospitalization (including imaging, microbiology, ancillary and laboratory) are listed below for reference.    Significant Diagnostic Studies: Dg Chest 2 View  Result Date: 08/01/2016 CLINICAL DATA:  Headache, syncope. EXAM: CHEST  2 VIEW COMPARISON:  None. FINDINGS: Left-sided pacemaker with leads projecting over the right atrium and ventricle. We from prior abdominal pacemaker also in place.The cardiomediastinal contours are normal. The lungs are clear. Pulmonary vasculature is normal. No consolidation, pleural effusion, or pneumothorax. No acute osseous abnormalities are seen. IMPRESSION: No active cardiopulmonary disease. Pacemaker in place. Electronically Signed   By: Rubye Oaks M.D.   On: 08/01/2016 20:49   Ct Head Wo Contrast  Result Date: 08/01/2016 CLINICAL DATA:  Dizzy. Syncopal episode while at home alone. Headache. EXAM: CT HEAD WITHOUT CONTRAST TECHNIQUE: Contiguous axial images were obtained from the base of the skull through the vertex without intravenous contrast. COMPARISON:  None. FINDINGS: Brain: No acute infarct, hemorrhage, or mass lesion is present. The ventricles are of normal size. No significant extraaxial fluid collection is present. No significant white matter disease  is present. The brainstem and cerebellum are normal. Vascular: No hyperdense vessel or unexpected calcification. Skull: The calvarium is within normal limits. No focal lytic or blastic lesions are present. Sinuses/Orbits: The paranasal sinuses and mastoid air cells are clear. The globes and orbits are intact. Other:  IMPRESSION: Negative CT of the head. Electronically Signed   By: Marin Robertshristopher  Mattern M.D.   On: 08/01/2016 20:42     Microbiology: No results found for this or any previous visit (from the past 240 hour(s)).   Labs: Basic Metabolic Panel:  Recent Labs Lab 08/01/16 1929 08/01/16 1937  NA 139 140  K 4.1 4.0  CL 106 103  CO2 26  --   GLUCOSE 83 81  BUN 10 12  CREATININE 0.97 0.90  CALCIUM 9.6  --    Liver Function Tests:  Recent Labs Lab 08/01/16 1929  AST 23  ALT 14  ALKPHOS 53  BILITOT 1.4*  PROT 7.9  ALBUMIN 4.3   No results for input(s): LIPASE, AMYLASE in the last 168 hours. No results for input(s): AMMONIA in the last 168 hours. CBC:  Recent Labs Lab 08/01/16 1929 08/01/16 1937  WBC 5.2  --   NEUTROABS 3.2  --   HGB 14.0 14.6  HCT 39.5 43.0  MCV 89.4  --   PLT 176  --    Cardiac Enzymes: No results for input(s): CKTOTAL, CKMB, CKMBINDEX, TROPONINI in the last 168 hours. BNP: Invalid input(s): POCBNP CBG:  Recent Labs Lab 08/01/16 1950 08/03/16 0449 08/04/16 0644 08/05/16 0626 08/06/16 0554  GLUCAP 89 97 120* 102* 127*    Time coordinating discharge:  Greater than 30 minutes  Signed:  Mishaal Lansdale, DO [facility] Pager: [phone] 08/06/2016, 12:35 PM

## 2016-08-05 NOTE — Progress Notes (Signed)
Patient sitting up in bed, no needs at this time. Call light within reach.  

## 2016-08-05 NOTE — Progress Notes (Signed)
ANTICOAGULATION CONSULT NOTE - Follow Up Consult  Pharmacy Consult for Coumadin and Lovenox Indication: h/o DVT  No Known Allergies  Patient Measurements: Height: 5\' 3"  (160 cm) Weight: 156 lb 4.8 oz (70.9 kg) IBW/kg (Calculated) : 52.4  Vital Signs: Temp: 98.1 F (36.7 C) (12/10 0557) Temp Source: Oral (12/10 0557) BP: 112/76 (12/10 0557) Pulse Rate: 66 (12/10 0557)  Labs:  Recent Labs  08/03/16 0220 08/04/16 0245 08/05/16 0209  LABPROT 18.1* 17.7* 21.7*  INR 1.49 1.45 1.86    Estimated Creatinine Clearance: 94.9 mL/min (by C-G formula based on SCr of 0.9 mg/dL).  Assessment: 20yof continues on coumadin for h/o DVT. Lovenox bridge added 12/9. INR finally starting to trend back up to 1.86 after 2 higher 8mg  doses given. No bleeding.  Home dose: 6mg  daily  Goal of Therapy:  INR 2-3 Monitor platelets by anticoagulation protocol: Yes   Plan:  1) Coumadin 6mg  x 1  2) Continue lovenox 70mg  sq q12 3) Daily INR, CBC q72h  Fredrik RiggerMarkle, Traves Majchrzak Sue 08/05/2016,11:09 AM

## 2016-08-06 LAB — PROTIME-INR
INR: 1.97
Prothrombin Time: 22.7 seconds — ABNORMAL HIGH (ref 11.4–15.2)

## 2016-08-06 LAB — GLUCOSE, CAPILLARY: Glucose-Capillary: 127 mg/dL — ABNORMAL HIGH (ref 65–99)

## 2016-08-06 MED ORDER — WARFARIN SODIUM 3 MG PO TABS: 6.0000 mg | ORAL_TABLET | Freq: Once | ORAL | Status: DC

## 2016-08-06 MED ORDER — WARFARIN SODIUM 4 MG PO TABS
8.0000 mg | ORAL_TABLET | Freq: Once | ORAL | Status: AC
  Administered 2016-08-06: 8 mg via ORAL
  Filled 2016-08-06: qty 2

## 2016-08-06 NOTE — Progress Notes (Signed)
ANTICOAGULATION CONSULT NOTE - Follow Up Consult  Pharmacy Consult for Coumadin and Lovenox Indication: h/o DVT  No Known Allergies  Patient Measurements: Height: 5\' 3"  (160 cm) Weight: 156 lb 4.8 oz (70.9 kg) IBW/kg (Calculated) : 52.4  Vital Signs: Temp: 98.1 F (36.7 C) (12/11 0610) Temp Source: Oral (12/11 0610) BP: 104/66 (12/11 0610) Pulse Rate: 84 (12/11 0610)  Labs:  Recent Labs  08/04/16 0245 08/05/16 0209 08/06/16 0324  LABPROT 17.7* 21.7* 22.7*  INR 1.45 1.86 1.97    Estimated Creatinine Clearance: 94.9 mL/min (by C-G formula based on SCr of 0.9 mg/dL).  Assessment: 20yof continues on coumadin for h/o DVT. Lovenox bridge added 12/9. INR finally starting to trend back up to 1.97 after 2 higher 8mg  doses given. No bleeding.  Home dose: 6mg  daily  Goal of Therapy:  INR 2-3 Monitor platelets by anticoagulation protocol: Yes   Plan:  1) Coumadin 6mg  x 1  2) Continue lovenox 70mg  sq q12 3) Daily INR, CBC q72h  Trigger Frasier L Navarro Nine 08/06/2016,8:28 AM

## 2016-08-07 ENCOUNTER — Inpatient Hospital Stay: Payer: Medicaid Other

## 2016-08-07 ENCOUNTER — Encounter: Payer: Self-pay | Admitting: Family Medicine

## 2016-08-07 ENCOUNTER — Ambulatory Visit: Payer: Medicaid Other | Attending: Family Medicine | Admitting: Family Medicine

## 2016-08-07 VITALS — BP 120/83 | HR 78 | Temp 98.4°F | Ht 63.0 in | Wt 159.6 lb

## 2016-08-07 DIAGNOSIS — Q246 Congenital heart block: Secondary | ICD-10-CM

## 2016-08-07 DIAGNOSIS — Z950 Presence of cardiac pacemaker: Secondary | ICD-10-CM | POA: Diagnosis not present

## 2016-08-07 DIAGNOSIS — Z86718 Personal history of other venous thrombosis and embolism: Secondary | ICD-10-CM | POA: Insufficient documentation

## 2016-08-07 DIAGNOSIS — Z09 Encounter for follow-up examination after completed treatment for conditions other than malignant neoplasm: Secondary | ICD-10-CM | POA: Insufficient documentation

## 2016-08-07 DIAGNOSIS — Z7901 Long term (current) use of anticoagulants: Secondary | ICD-10-CM | POA: Insufficient documentation

## 2016-08-07 DIAGNOSIS — Z8673 Personal history of transient ischemic attack (TIA), and cerebral infarction without residual deficits: Secondary | ICD-10-CM | POA: Diagnosis not present

## 2016-08-07 DIAGNOSIS — Z5181 Encounter for therapeutic drug level monitoring: Secondary | ICD-10-CM | POA: Insufficient documentation

## 2016-08-07 LAB — POCT INR: INR: 2.5

## 2016-08-07 MED ORDER — WARFARIN SODIUM 5 MG PO TABS: 5.0000 mg | ORAL_TABLET | Freq: Every day | ORAL | 3 refills | Status: DC

## 2016-08-07 MED ORDER — WARFARIN SODIUM 1 MG PO TABS: 1.0000 mg | ORAL_TABLET | Freq: Once | ORAL | 3 refills | Status: DC

## 2016-08-07 MED ORDER — ATENOLOL 25 MG PO TABS: 25.0000 mg | ORAL_TABLET | Freq: Every day | ORAL | 3 refills | Status: DC

## 2016-08-07 NOTE — Patient Instructions (Addendum)
Sigurd Sosntonesia was seen today for hospitalization follow-up.  Diagnoses and all orders for this visit:  History of DVT (deep vein thrombosis) -     INR -     warfarin (COUMADIN) 1 MG tablet; Take 1 tablet (1 mg total) by mouth one time only at 6 PM. Take with 5 mg tablet to make total dose of 6 mg daily -     warfarin (COUMADIN) 5 MG tablet; Take 1 tablet (5 mg total) by mouth daily at 6 PM. Take with 1 mg tablet to make total dose of 6 mg daily -     Ambulatory referral to Hematology  Encounter for monitoring coumadin therapy -     INR  Congenital heart block -     Ambulatory referral to Cardiology  Pacemaker -     atenolol (TENORMIN) 25 MG tablet; Take 1 tablet (25 mg total) by mouth daily. -     Ambulatory referral to Cardiology   Referral has been placed to  Cardiology Specialty:  Cardiology Why:  If you need to see an electrophys[place]ologist [place] you can call us. Otherwise, please continue to follow with your doctors in [place]. Contact information: [address] [place] [address] [phone]  F/u in 6 weeks for INR check again   Dr. Armen PickupFunches

## 2016-08-07 NOTE — Progress Notes (Signed)
LOGO@  Subjective:  Patient ID: Elizabeth Ballard, female    DOB: 04/17/1996  Age: 20 y.o. MRN: [id number]  CC: Hospitalization Follow-up   HPI Elizabeth Ballard presents for   1. Hospitalization follow up: she was hosptialized from 12/6-12/11/17 for syncope x 2 and headache. She has a pacemaker due to congenital heart block. Her pacemaker was interrogated without pauses or dysrhythmias. She has has of DVT x 2 on coumadin. Her INR was subtherapeutic at 1.77. She was bridged with lovenox. No leg pain or swelling. No recurrent syncope. No headaches.   Past Medical History:  Diagnosis Date  . Acute deep vein thrombosis (DVT) (HCC)   . Heart block   . Presence of permanent cardiac pacemaker 1998   FOR CONGENITAL HEART BLOCK  . TIA (transient ischemic attack)     Past Surgical History:  Procedure Laterality Date  . INSERT / REPLACE / REMOVE PACEMAKER  1998   CONGENITAL HEART BLOCK  . PACEMAKER INSERTION      No family history on file.  Social History  Substance Use Topics  . Smoking status: Never Smoker  . Smokeless tobacco: Never Used  . Alcohol use Yes    ROS Review of Systems  Constitutional: Negative for chills and fever.  Eyes: Negative for visual disturbance.  Respiratory: Negative for shortness of breath.   Cardiovascular: Negative for chest pain.  Gastrointestinal: Negative for abdominal pain and blood in stool.  Musculoskeletal: Negative for arthralgias and back pain.  Skin: Negative for rash.  Allergic/Immunologic: Negative for immunocompromised state.  Hematological: Negative for adenopathy. Does not bruise/bleed easily.  Psychiatric/Behavioral: Negative for dysphoric mood and suicidal ideas.    Objective:   Today's Vitals: BP 120/83 (BP Location: Right Arm, Patient Position: Sitting, Cuff Size: Small)   Pulse 78   Temp 98.4 F (36.9 C) (Oral)   Ht 5\' 3"  (1.6 m)   Wt 159 lb 9.6 oz (72.4 kg)   LMP 07/06/2016 (Approximate)   SpO2 99%   BMI 28.27 kg/m    Physical Exam  Constitutional: She is oriented to person, place, and time. She appears well-developed and well-nourished. No distress.  HENT:  Head: Normocephalic and atraumatic.  Cardiovascular: Normal rate, regular rhythm, normal heart sounds and intact distal pulses.   Pulmonary/Chest: Effort normal and breath sounds normal.  Musculoskeletal: She exhibits no edema.  Neurological: She is alert and oriented to person, place, and time.  Skin: Skin is warm and dry. No rash noted.  Psychiatric: She has a normal mood and affect.    Assessment & Plan:   Problem List Items Addressed This Visit      Unprioritized   Pacemaker   Relevant Medications   atenolol (TENORMIN) 25 MG tablet   Other Relevant Orders   Ambulatory referral to Cardiology   History of DVT (deep vein thrombosis) - Primary   Relevant Medications   warfarin (COUMADIN) 1 MG tablet   warfarin (COUMADIN) 5 MG tablet   Other Relevant Orders   INR (Completed)   Ambulatory referral to Hematology   Encounter for monitoring coumadin therapy   Relevant Orders   INR (Completed)   Congenital heart block   Relevant Medications   warfarin (COUMADIN) 1 MG tablet   warfarin (COUMADIN) 5 MG tablet   atenolol (TENORMIN) 25 MG tablet   Other Relevant Orders   Ambulatory referral to Cardiology      Outpatient Encounter Prescriptions as of 08/07/2016  Medication Sig  . atenolol (TENORMIN) 25 MG tablet Take 25 mg  by mouth daily.  [name]. warfarin (COUMADIN) 1 MG tablet Take 6 mg by mouth daily.  [name]. warfarin (COUMADIN) 5 MG tablet Take 6 mg by mouth daily.   No facility-administered encounter medications on file as of 08/07/2016.     Follow-up: Return in about 6 weeks (around 09/18/2016) for INR check .    Dessa PhiJosalyn Angeleena Dueitt MD

## 2016-08-08 NOTE — Assessment & Plan Note (Signed)
Hx of DVT x 2  Pacemaker Hx of TIA On coumadin INR therapeutic Continue current coumadin dose

## 2016-08-10 ENCOUNTER — Telehealth: Payer: Self-pay | Admitting: Hematology

## 2016-08-10 NOTE — Telephone Encounter (Signed)
Pt confirmed appt., verified demo and insurance.  °

## 2016-08-13 ENCOUNTER — Encounter: Payer: Self-pay | Admitting: Hematology

## 2016-09-19 ENCOUNTER — Telehealth: Payer: Self-pay | Admitting: Hematology

## 2016-09-19 ENCOUNTER — Encounter: Payer: Self-pay | Admitting: Internal Medicine

## 2016-09-19 ENCOUNTER — Encounter: Payer: Medicaid Other | Admitting: Hematology

## 2016-09-19 NOTE — Telephone Encounter (Signed)
Pt cld to reschedule appt. APpt has been rescheduled to 2/5 @2pm . Pt agreed to the appt date and time. Aware to arrive 30 minutes early.

## 2016-10-01 ENCOUNTER — Telehealth: Payer: Self-pay | Admitting: Hematology

## 2016-10-01 ENCOUNTER — Ambulatory Visit (HOSPITAL_BASED_OUTPATIENT_CLINIC_OR_DEPARTMENT_OTHER): Payer: Medicaid Other | Admitting: Hematology

## 2016-10-01 ENCOUNTER — Encounter: Payer: Self-pay | Admitting: Hematology

## 2016-10-01 ENCOUNTER — Ambulatory Visit (HOSPITAL_BASED_OUTPATIENT_CLINIC_OR_DEPARTMENT_OTHER): Payer: Medicaid Other

## 2016-10-01 VITALS — BP 127/76 | HR 77 | Temp 98.1°F | Resp 17 | Ht 63.0 in | Wt 156.0 lb

## 2016-10-01 DIAGNOSIS — Z8673 Personal history of transient ischemic attack (TIA), and cerebral infarction without residual deficits: Secondary | ICD-10-CM

## 2016-10-01 DIAGNOSIS — Z7901 Long term (current) use of anticoagulants: Secondary | ICD-10-CM

## 2016-10-01 DIAGNOSIS — D6859 Other primary thrombophilia: Secondary | ICD-10-CM | POA: Diagnosis present

## 2016-10-01 DIAGNOSIS — Z86718 Personal history of other venous thrombosis and embolism: Secondary | ICD-10-CM

## 2016-10-01 DIAGNOSIS — I824Z2 Acute embolism and thrombosis of unspecified deep veins of left distal lower extremity: Secondary | ICD-10-CM

## 2016-10-01 DIAGNOSIS — G459 Transient cerebral ischemic attack, unspecified: Secondary | ICD-10-CM

## 2016-10-01 LAB — COMPREHENSIVE METABOLIC PANEL
ALBUMIN: 4.2 g/dL (ref 3.5–5.0)
ALT: 14 U/L (ref 0–55)
ANION GAP: 7 meq/L (ref 3–11)
AST: 16 U/L (ref 5–34)
Alkaline Phosphatase: 59 U/L (ref 40–150)
BILIRUBIN TOTAL: 1.01 mg/dL (ref 0.20–1.20)
BUN: 10 mg/dL (ref 7.0–26.0)
CALCIUM: 9.5 mg/dL (ref 8.4–10.4)
CO2: 25 mEq/L (ref 22–29)
CREATININE: 1 mg/dL (ref 0.6–1.1)
Chloride: 106 mEq/L (ref 98–109)
EGFR: >90 mL/min/{1.73_m2} (ref >90)
Glucose: 93 mg/dl (ref 70–140)
Potassium: 4.1 mEq/L (ref 3.5–5.1)
Sodium: 138 mEq/L (ref 136–145)
TOTAL PROTEIN: 7.7 g/dL (ref 6.4–8.3)

## 2016-10-01 LAB — CBC & DIFF AND RETIC
BASO%: 0.2 % (ref 0.0–2.0)
BASOS ABS: 0 10*3/uL (ref 0.0–0.1)
EOS ABS: 0.1 10*3/uL (ref 0.0–0.5)
EOS%: 2.2 % (ref 0.0–7.0)
HCT: 40.4 % (ref 34.8–46.6)
HEMOGLOBIN: 13.7 g/dL (ref 11.6–15.9)
IMMATURE RETIC FRACT: 1.1 % — AB (ref 1.60–10.00)
LYMPH%: 36.8 % (ref 14.0–49.7)
MCH: 31.4 pg (ref 25.1–34.0)
MCHC: 33.9 g/dL (ref 31.5–36.0)
MCV: 92.4 fL (ref 79.5–101.0)
MONO#: 0.2 10*3/uL (ref 0.1–0.9)
MONO%: 4.4 % (ref 0.0–14.0)
NEUT#: 2.6 10*3/uL (ref 1.5–6.5)
NEUT%: 56.4 % (ref 38.4–76.8)
PLATELETS: 144 10*3/uL — AB (ref 145–400)
RBC: 4.37 10*6/uL (ref 3.70–5.45)
RDW: 12.3 % (ref 11.2–14.5)
RETIC CT ABS: 66.42 10*3/uL (ref 33.70–90.70)
Retic %: 1.52 % (ref 0.70–2.10)
WBC: 4.6 10*3/uL (ref 3.9–10.3)
lymph#: 1.7 10*3/uL (ref 0.9–3.3)

## 2016-10-01 NOTE — Telephone Encounter (Signed)
Appointments scheduled per 2/5 LOS. Patient given AVS report and calendars with future scheduled appointments. °

## 2016-10-01 NOTE — Progress Notes (Signed)
[name]    HEMATOLOGY/ONCOLOGY CONSULTATION NOTE  Date of Service: 10/01/2016  PCP: Dr Dessa Phi MD  Cardiology: Tobias Alexander MD  CHIEF COMPLAINTS/PURPOSE OF CONSULTATION:  H/o blood clots. Recommendations for anticoagulation.  HISTORY OF PRESENTING ILLNESS:   Elizabeth Ballard is a wonderful 21 y.o. female who has been referred to [place] by Dr Dr Dessa Phi MD  for evaluation and management of h/o blood clots for recommendations regarding ongoing anticoagulation.  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  Patient notes that she first had a left lower extremity DVT in 2010 which was thought to be provoked since she had a nexplanon implant which was subsequently removed. She was about 21 years old at the time and notes that she was treated with baby aspirin with resolution of the blood clot.   She subsequently had a clot in her permanent pacemaker wires at age 82 years and was again treated with aspirin.  She notes that she had TIAs at age 52 years and was started on Coumadin and was recommended to be on Coumadin for the rest of her life. It is unclear what the etiology of her TIAs were. She notes that she had presented with slurred speech and arm numbness.[name]  She was seen by pediatric cardiology, neurology as well as pediatric hematology oncology. She reports that she was under the care of Dr. Cleophas Dunker [phone]. Pediatric hematology oncology who had recommended long-term anticoagulation.  Patient notes no issues with bleeding. She was hospitalized for syncope 2 in December 2017. Her pacemaker was interrogated and apparently there were no pauses or dysrhythmias.  We do not have access to her outside pediatric hematology oncology records with regards to the reasoning for recommendations for long-term anticoagulation.  Patient notes no other acute symptoms at this  time. She does not report having had any hypercoagulability workup in the past.  MEDICAL HISTORY:  Past Medical History:  Diagnosis Date  . Acute deep vein thrombosis (DVT) (HCC) 08/27/2012 and 08/27/2008  . Heart block   . Presence of permanent cardiac pacemaker 1998   FOR CONGENITAL HEART BLOCK  . TIA (transient ischemic attack) 08/27/2013    SURGICAL HISTORY: Past Surgical History:  Procedure Laterality Date  . INSERT / REPLACE / REMOVE PACEMAKER  1998   CONGENITAL HEART BLOCK  . PACEMAKER INSERTION      SOCIAL HISTORY: Social History   Social History  . Marital status: Single    Spouse name: N/A  . Number of children: N/A  . Years of education: N/A   Occupational History  . Not on file.   Social History Main Topics  . Smoking status: Never Smoker  . Smokeless tobacco: Never Used  . Alcohol use Yes  . Drug use: No  . Sexual activity: Not on file   Other Topics Concern  . Not on file   Social History Narrative  . No narrative on file    FAMILY HISTORY: History reviewed. No pertinent family history.  ALLERGIES:  has No Known Allergies.  MEDICATIONS:  Current Outpatient Prescriptions  Medication Sig Dispense Refill  . atenolol (TENORMIN) 25 MG tablet Take 1 tablet (25 mg total) by mouth daily. 90 tablet 3  . warfarin (COUMADIN) 1 MG tablet Take 1 tablet (1 mg total) by mouth one time only at 6 PM. Take with 5 mg tablet to make total dose of 6 mg  daily (Patient taking differently: Take 2 mg by mouth one time only at 6 PM. Take with 5 mg tablet to make total dose of 6 mg daily) 90 tablet 3  . warfarin (COUMADIN) 5 MG tablet Take 1 tablet (5 mg total) by mouth daily at 6 PM. Take with 1 mg tablet to make total dose of 6 mg daily 90 tablet 3   No current facility-administered medications for this visit.     REVIEW OF SYSTEMS:    10 Point review of Systems was done is negative except as noted above.  PHYSICAL EXAMINATION: ECOG PERFORMANCE STATUS: 0 -  Asymptomatic  . Vitals:   10/01/16 1403  BP: 127/76  Pulse: 77  Resp: 17  Temp: 98.1 F (36.7 C)   Filed Weights   10/01/16 1403  Weight: 156 lb (70.8 kg)   .Body mass index is 27.63 kg/m.  GENERAL:alert, in no acute distress and comfortable SKIN: skin color, texture, turgor are normal, no rashes or significant lesions EYES: normal, conjunctiva are pink and non-injected, sclera clear OROPHARYNX:no exudate, no erythema and lips, buccal mucosa, and tongue normal  NECK: supple, no JVD, thyroid normal size, non-tender, without nodularity LYMPH:  no palpable lymphadenopathy in the cervical, axillary or inguinal LUNGS: clear to auscultation with normal respiratory effort HEART: regular rate & rhythm,  no murmurs and no lower extremity edema ABDOMEN: abdomen soft, non-tender, normoactive bowel sounds , No palpable hepatosplenomegaly Musculoskeletal: no cyanosis of digits and no clubbing  PSYCH: alert & oriented x 3 with fluent speech NEURO: no focal motor/sensory deficits  LABORATORY DATA:  I have reviewed the data as listed  . CBC Latest Ref Rng & Units 10/01/2016 08/01/2016 08/01/2016  WBC 3.9 - 10.3 10e3/uL 4.6 - 5.2  Hemoglobin 11.6 - 15.9 g/dL 69.613.7 29.514.6 28.414.0  Hematocrit 34.8 - 46.6 % 40.4 43.0 39.5  Platelets 145 - 400 10e3/uL 144(L) - 176    . CMP Latest Ref Rng & Units 10/01/2016 08/01/2016 08/01/2016  Glucose 70 - 140 mg/dl 93 81 83  BUN 7.0 - 13.226.0 mg/dL 44.010.0 12 10  Creatinine 0.6 - 1.1 mg/dL 1.0 1.020.90 7.250.97  Sodium 366136 - 145 mEq/L 138 140 139  Potassium 3.5 - 5.1 mEq/L 4.1 4.0 4.1  Chloride 101 - 111 mmol/L - 103 106  CO2 22 - 29 mEq/L 25 - 26  Calcium 8.4 - 10.4 mg/dL 9.5 - 9.6  Total Protein 6.4 - 8.3 g/dL 7.7 - 7.9  Total Bilirubin 0.20 - 1.20 mg/dL 4.401.01 - 1.4(H)  Alkaline Phos 40 - 150 U/L 59 - 53  AST 5 - 34 U/L 16 - 23  ALT 0 - 55 U/L 14 - 14     RADIOGRAPHIC STUDIES: I have personally reviewed the radiological images as listed and agreed with the findings  in the report. No results found.  ASSESSMENT & PLAN:   21 year old female with  #1 history of left lower extremity DVT at age 89 years in 2010. She notes that it was thought to be related to her nexplanon. This was subsequently removed and she was apparently treated with ASA with resolution of blood clot.  #2 thrombosis on permanent cardiac pacemaker wires at age 21 years. He was treated with baby aspirin with resolution.  #3 history of TIA with slurred speech and all numbness at age 21 years. Patient notes that she has been on Coumadin since then and was recommend long-term anticoagulation by her pediatric hematologist oncologist and pediatric neurologist. Unclear etiology of her  TIA/CVA. No residual symptoms from this. Has not had any issues with bleeding on Coumadin.  Denies any family history of known thrombophilia. Does not report a previous hypercoagulability workup.  PLAN. -We do not have access to her previous pediatric hematologist and pediatric neurologist and cardiologist notes to know the logic behind recommendations for long-term anticoagulation. -This could have been an empiric neurology recommendation. -Uncertain that she had hypercoagulability workup by her pediatric hematologist oncologist regarding these recommendations. -We shall try to get her outside records to determine what the status and logic of these recommendations were. -We counseled the patient and she is agreeable to getting hypercoagulability workup. Labs were ordered for this as noted below -She was recommended to continue her Coumadin as per her previous recommendations from her pediatric hematologist oncologist unless we provide any new recommendations. -Would recommend that her cardiologist and neurologist weigh in regarding etiology and risk of TIA/CVA's and risk of re-thrombosis of her PPM leads. -The decision for ongoing anticoagulation would be determined from a hematologic cardiology and neurology  perspective. -If she has an overt thrombophilia on testing we would be able to provide more specific recommendations.  Labs today RTC with Dr Candise Che in 2 weeks  Orders Placed This Encounter  Procedures  . CBC & Diff and Retic    Standing Status:   Future    Number of Occurrences:   1    Standing Expiration Date:   10/01/2017  . Comprehensive metabolic panel    Standing Status:   Future    Number of Occurrences:   1    Standing Expiration Date:   10/01/2017  . Factor 5 leiden    Standing Status:   Future    Number of Occurrences:   1    Standing Expiration Date:   11/05/2017  . Prothrombin gene mutation    Standing Status:   Future    Number of Occurrences:   1    Standing Expiration Date:   11/05/2017  . Antithrombin III    Standing Status:   Future    Number of Occurrences:   1    Standing Expiration Date:   10/01/2017  . Homocysteine, serum    Standing Status:   Future    Number of Occurrences:   1    Standing Expiration Date:   10/01/2017  . Lupus anticoagulant panel    Standing Status:   Future    Number of Occurrences:   1    Standing Expiration Date:   11/05/2017  . Cardiolipin antibodies, IgG, IgM, IgA    Standing Status:   Future    Number of Occurrences:   1    Standing Expiration Date:   10/01/2017  . Beta-2-glycoprotein i abs, IgG/M/A    Standing Status:   Future    Number of Occurrences:   1    Standing Expiration Date:   10/01/2017     All of the patients questions were answered with apparent satisfaction. The patient knows to call the clinic with any problems, questions or concerns.  I spent 45 minutes counseling the patient face to face. The total time spent in the appointment was 60 minutes and more than 50% was on counseling and direct patient cares.    Wyvonnia Lora MD MS AAHIVMS [facility] Hematology/Oncology Physician [facility]  (Office):       [phone] (Work cell):  [phone] (Fax):           [phone]  10/01/2016 2:31 PM

## 2016-10-01 NOTE — Progress Notes (Signed)
Per Dr. Candise CheKale, LVM with Dr. Cleophas DunkerWhitfield ([place] [phone] requesting copy of most recent medical records.

## 2016-10-02 ENCOUNTER — Telehealth: Payer: Self-pay

## 2016-10-02 LAB — LUPUS ANTICOAGULANT PANEL
DRVVT MIX: 43.6 s (ref 0.0–47.0)
DRVVT: 79.6 s — AB (ref 0.0–47.0)
PTT-LA: 51.9 s (ref 0.0–51.9)

## 2016-10-02 LAB — ANTITHROMBIN III: ANTITHROMBIN ACTIVITY: 98 % (ref 75–135)

## 2016-10-02 LAB — HOMOCYSTEINE: Homocysteine: 7.9 umol/L (ref 0.0–15.0)

## 2016-10-02 NOTE — Telephone Encounter (Signed)
Pt called to see if anyone turned in a set of keys. Called pt back that Wilma does have a set of keys. [facility]

## 2016-10-03 LAB — BETA-2-GLYCOPROTEIN I ABS, IGG/M/A
Beta-2 Glyco 1 IgA: <9 GPI IgA units (ref 0–25)
Beta-2 Glyco 1 IgM: <9 GPI IgM units (ref 0–32)

## 2016-10-03 LAB — CARDIOLIPIN ANTIBODIES, IGG, IGM, IGA
ANTICARDIOLIPIN IGM: 11 [MPL'U]/mL (ref 0–12)
Anticardiolipin Ab,IgG,Qn: <9 GPL U/mL (ref 0–14)

## 2016-10-05 LAB — PROTHROMBIN GENE MUTATION

## 2016-10-05 LAB — FACTOR 5 LEIDEN

## 2016-10-15 ENCOUNTER — Ambulatory Visit (HOSPITAL_BASED_OUTPATIENT_CLINIC_OR_DEPARTMENT_OTHER): Payer: Medicaid Other | Admitting: Hematology

## 2016-10-15 ENCOUNTER — Encounter: Payer: Self-pay | Admitting: Hematology

## 2016-10-15 VITALS — BP 110/70 | HR 71 | Temp 98.3°F | Resp 16

## 2016-10-15 DIAGNOSIS — G459 Transient cerebral ischemic attack, unspecified: Secondary | ICD-10-CM

## 2016-10-15 DIAGNOSIS — Z86718 Personal history of other venous thrombosis and embolism: Secondary | ICD-10-CM | POA: Diagnosis present

## 2016-10-15 DIAGNOSIS — I824Z2 Acute embolism and thrombosis of unspecified deep veins of left distal lower extremity: Secondary | ICD-10-CM

## 2016-10-15 DIAGNOSIS — D6859 Other primary thrombophilia: Secondary | ICD-10-CM

## 2016-10-15 DIAGNOSIS — Z8673 Personal history of transient ischemic attack (TIA), and cerebral infarction without residual deficits: Secondary | ICD-10-CM | POA: Diagnosis not present

## 2016-10-15 DIAGNOSIS — Z7901 Long term (current) use of anticoagulants: Secondary | ICD-10-CM

## 2016-10-21 NOTE — Progress Notes (Signed)
[name].    HEMATOLOGY/ONCOLOGY CLINIC NOTE  Date of Service: .10/15/2016  PCP: Dr Dessa PhiJosalyn Funches MD  Cardiology: Tobias AlexanderKatarina Nelson MD  CHIEF COMPLAINTS/PURPOSE OF CONSULTATION:  H/o blood clots. Recommendations for anticoagulation.  HISTORY OF PRESENTING ILLNESS:   Elizabeth Ballard is a wonderful 21 y.o. female who has been referred to us by Dr Dr Dessa PhiJosalyn Funches MD  for evaluation and management of h/o blood clots for recommendations regarding ongoing anticoagulation.  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  Patient notes that she first had a left lower extremity DVT in 2010 which was thought to be provoked since she had a nexplanon implant which was subsequently removed. She was about 21 years old at the time and notes that she was treated with baby aspirin with resolution of the blood clot.   She subsequently had a clot in her permanent pacemaker wires at age 21 years and was again treated with aspirin.  She notes that she had TIAs at age 21 years and was started on Coumadin and was recommended to be on Coumadin for the rest of her life. It is unclear what the etiology of her TIAs were. She notes that she had presented with slurred speech and arm numbness.[name].  She was seen by pediatric cardiology, neurology as well as pediatric hematology oncology. She reports that she was under the care of Dr. Cleophas DunkerWhitfield [phone]. Pediatric hematology oncology who had recommended long-term anticoagulation.  Patient notes no issues with bleeding. She was hospitalized for syncope 2 in December 2017. Her pacemaker was interrogated and apparently there were no pauses or dysrhythmias.  We do not have access to her outside pediatric hematology oncology records with regards to the reasoning for recommendations for long-term anticoagulation.  Patient notes no other acute symptoms at this  time. She does not report having had any hypercoagulability workup in the past.  INTERVAL HISTORY  Patient is here for her scheduled followup to discuss results of her thrombophilia workup. We discussed all the results of her thrombophilia workup which is unrevealing. We called her pediatric hematologist's office multiple times and have still not received her outside records. No acute new symptoms.   MEDICAL HISTORY:  Past Medical History:  Diagnosis Date  . Acute deep vein thrombosis (DVT) (HCC) 08/27/2012 and 08/27/2008  . Heart block   . Presence of permanent cardiac pacemaker 1998   FOR CONGENITAL HEART BLOCK  . TIA (transient ischemic attack) 08/27/2013    SURGICAL HISTORY: Past Surgical History:  Procedure Laterality Date  . INSERT / REPLACE / REMOVE PACEMAKER  1998   CONGENITAL HEART BLOCK  . PACEMAKER INSERTION      SOCIAL HISTORY: Social History   Social History  . Marital status: Single    Spouse name: N/A  . Number of children: N/A  . Years of education: N/A   Occupational History  . Not on file.   Social History Main Topics  . Smoking status: Never Smoker  . Smokeless tobacco: Never Used  . Alcohol use Yes  . Drug use: No  . Sexual activity: Not on file   Other Topics Concern  . Not on file   Social History Narrative  . No narrative on file    FAMILY HISTORY: History reviewed. No pertinent family history.  ALLERGIES:  has No Known Allergies.  MEDICATIONS:  Current Outpatient Prescriptions  Medication Sig Dispense Refill  .  atenolol (TENORMIN) 25 MG tablet Take 1 tablet (25 mg total) by mouth daily. 90 tablet 3  . warfarin (COUMADIN) 1 MG tablet Take 1 tablet (1 mg total) by mouth one time only at 6 PM. Take with 5 mg tablet to make total dose of 6 mg daily (Patient taking differently: Take 2 mg by mouth one time only at 6 PM. Take with 5 mg tablet to make total dose of 6 mg daily) 90 tablet 3  . warfarin (COUMADIN) 5 MG tablet Take 1 tablet (5 mg  total) by mouth daily at 6 PM. Take with 1 mg tablet to make total dose of 6 mg daily 90 tablet 3   No current facility-administered medications for this visit.     REVIEW OF SYSTEMS:    10 Point review of Systems was done is negative except as noted above.  PHYSICAL EXAMINATION: ECOG PERFORMANCE STATUS: 0 - Asymptomatic  . Vitals:   10/15/16 1417  BP: 110/70  Pulse: 71  Resp: 16  Temp: 98.3 F (36.8 C)   There were no vitals filed for this visit. .There is no height or weight on file to calculate BMI.  GENERAL:alert, in no acute distress and comfortable SKIN: skin color, texture, turgor are normal, no rashes or significant lesions EYES: normal, conjunctiva are pink and non-injected, sclera clear OROPHARYNX:no exudate, no erythema and lips, buccal mucosa, and tongue normal  NECK: supple, no JVD, thyroid normal size, non-tender, without nodularity LYMPH:  no palpable lymphadenopathy in the cervical, axillary or inguinal LUNGS: clear to auscultation with normal respiratory effort HEART: regular rate & rhythm,  no murmurs and no lower extremity edema ABDOMEN: abdomen soft, non-tender, normoactive bowel sounds , No palpable hepatosplenomegaly Musculoskeletal: no cyanosis of digits and no clubbing  PSYCH: alert & oriented x 3 with fluent speech NEURO: no focal motor/sensory deficits  LABORATORY DATA:  I have reviewed the data as listed  . CBC Latest Ref Rng & Units 10/01/2016 08/01/2016 08/01/2016  WBC 3.9 - 10.3 10e3/uL 4.6 - 5.2  Hemoglobin 11.6 - 15.9 g/dL 09.8 11.9 14.7  Hematocrit 34.8 - 46.6 % 40.4 43.0 39.5  Platelets 145 - 400 10e3/uL 144(L) - 176    . CMP Latest Ref Rng & Units 10/01/2016 08/01/2016 08/01/2016  Glucose 70 - 140 mg/dl 93 81 83  BUN 7.0 - 82.9 mg/dL 56.2 12 10  Creatinine 0.6 - 1.1 mg/dL 1.0 1.30 8.65  Sodium 784 - 145 mEq/L 138 140 139  Potassium 3.5 - 5.1 mEq/L 4.1 4.0 4.1  Chloride 101 - 111 mmol/L - 103 106  CO2 22 - 29 mEq/L 25 - 26  Calcium  8.4 - 10.4 mg/dL 9.5 - 9.6  Total Protein 6.4 - 8.3 g/dL 7.7 - 7.9  Total Bilirubin 0.20 - 1.20 mg/dL 6.96 - 1.4(H)  Alkaline Phos 40 - 150 U/L 59 - 53  AST 5 - 34 U/L 16 - 23  ALT 0 - 55 U/L 14 - 14    Component     Latest Ref Rng & Units 10/01/2016  PTT-LA     0.0 - 51.9 sec 51.9  DRVVT     0.0 - 47.0 sec 79.6 (H)  dRVVT Mix     0.0 - 47.0 sec 43.6  Lupus Anticoag Interp      Comment:  Anticardiolipin Ab,IgG,Qn     0 - 14 GPL U/mL <9  Anticardiolipin Ab,IgM,Qn     0 - 12 MPL U/mL 11  Anticardiolipin Ab,IgA,Qn  0 - 11 APL U/mL <9  Beta-2 Glycoprotein I Ab, IgG     0 - 20 GPI IgG units <9  Beta-2 Glyco 1 IgA     0 - 25 GPI IgA units <9  Beta-2 Glyco 1 IgM     0 - 32 GPI IgM units <9  Antithrombin Activity     75 - 135 % 98  Homocysteine     0.0 - 15.0 umol/L 7.9   Lupus anticoagulant panel      No reference range information available      Comments:           No lupus anticoagulant was detected.   Prothrombin gene mutation  Order: [id number]  Status:  Final result Visible to patient:  No (Not Released) Next appt:  None Dx:  Primary hypercoagulable state (HCC); ...   2wk ago  Factor II, DNA Analysis Comment   Comments: NEGATIVE  No mutation identified.       Factor 5 leiden  Order: [id number]  Status:  Final result Visible to patient:  No (Not Released) Next appt:  None Dx:  Primary hypercoagulable state (HCC); ...   2wk ago  Factor V Leiden Comment   Comments: Result: Negative (no mutation found)        RADIOGRAPHIC STUDIES: I have personally reviewed the radiological images as listed and agreed with the findings in the report. No results found.  ASSESSMENT & PLAN:   21 year old female with  #1 history of left lower extremity DVT at age 44 years in 2010. She notes that it was thought to be related to her nexplanon. This was subsequently removed and she was apparently treated with ASA with resolution of blood clot.  #2 thrombosis on permanent  cardiac pacemaker wires at age 50 years. He was treated with baby aspirin with resolution.  #3 history of TIA with slurred speech and all numbness at age 60 years. Patient notes that she has been on Coumadin since then and was recommend long-term anticoagulation by her pediatric hematologist oncologist and pediatric neurologist. Unclear etiology of her TIA/CVA. No residual symptoms from this. Has not had any issues with bleeding on Coumadin.  Denies any family history of known thrombophilia.  Hypercoagulability workup done  Factor V Leiden mutation negative, prothrombin gene mutation negative. No evidence of antiphospholipid antibodies Antithrombin III within normal limits Protein C and protein S testing could not be done since these would be lowered by Coumadin.  PLAN. -We still do not have access to her previous pediatric hematologist and pediatric neurologist and cardiologist notes to know the logic behind recommendations for long-term anticoagulation. -We discussed the thrombophilia workup in details and noted that this is not revealing. -Given the absence of clear unprovoked thrombosis, negative thrombophilia workup and no family history of clear thrombophilia - no clearly determined evidence of inherited or acquired thrombophilia. Though this cannot be completely ruled out. -If patient chooses to go off Coumadin to a different new or anticoagulant. Could check protein C and protein S levels and activity after being off Coumadin for about 3-4 weeks to complete the thrombophilia workup. -Would recommend continued Coumadin at this time.  -would recommend Cardiology and neurology input to determine stroke and TIA risk from a structure heart disease perspective and perspective of previous thrombosis related to PPM wires. -if cardiology and neurology clears patient to be off anticoagulation would pursue atleast baby ASA long term. -patient was made aware that there is no way to fully r/o  thrombophilic  state.  Continue f/u with PCP Kindly let us know if any new questions or concerns arise  No orders of the defined types were placed in this encounter.    All of the patients questions were answered with apparent satisfaction. The patient knows to call the clinic with any problems, questions or concerns.  I spent 25 minutes counseling the patient face to face. The total time spent in the appointment was 30 minutes and more than 50% was on counseling and direct patient cares.    Wyvonnia Lora MD MS AAHIVMS [facility] Hematology/Oncology Physician [facility]  (Office):       [phone] (Work cell):  [phone] (Fax):           [phone]

## 2016-10-30 ENCOUNTER — Encounter: Payer: Medicaid Other | Admitting: Pharmacist

## 2016-11-02 ENCOUNTER — Other Ambulatory Visit: Payer: Self-pay | Admitting: Family Medicine

## 2016-11-02 DIAGNOSIS — Z86718 Personal history of other venous thrombosis and embolism: Secondary | ICD-10-CM

## 2016-11-05 MED ORDER — WARFARIN SODIUM 5 MG PO TABS
ORAL_TABLET | ORAL | 3 refills | Status: DC
Start: 2016-11-05 — End: 2016-12-31

## 2016-11-22 ENCOUNTER — Ambulatory Visit: Payer: Medicaid Other | Attending: Internal Medicine | Admitting: Pharmacist

## 2016-11-22 DIAGNOSIS — Z7901 Long term (current) use of anticoagulants: Principal | ICD-10-CM

## 2016-11-22 DIAGNOSIS — Z5181 Encounter for therapeutic drug level monitoring: Secondary | ICD-10-CM

## 2016-11-22 DIAGNOSIS — Z86718 Personal history of other venous thrombosis and embolism: Secondary | ICD-10-CM

## 2016-11-22 LAB — POCT INR: INR: 2.4

## 2016-11-22 NOTE — Progress Notes (Signed)
Patient lost for follow up for INR. Instructed patient to keep regular (at least monthly) follow up for INR checks for her safety.

## 2016-12-26 ENCOUNTER — Encounter: Payer: Medicaid Other | Admitting: Pharmacist

## 2016-12-27 ENCOUNTER — Encounter: Payer: Medicaid Other | Admitting: Pharmacist

## 2016-12-28 ENCOUNTER — Telehealth: Payer: Self-pay | Admitting: Family Medicine

## 2016-12-28 DIAGNOSIS — Z86718 Personal history of other venous thrombosis and embolism: Secondary | ICD-10-CM

## 2016-12-28 NOTE — Telephone Encounter (Signed)
PT called to request a refill for warfarin (COUMADIN) 1 MG tablet   since she was told by the pcp to take a total of 7mg  per day, but she only has for 6mg , please need a new prescription for the difference of mg, please follow up with pt...[name].[name].[name]. Please sent the prescription the the pharmacy on [address]

## 2016-12-31 MED ORDER — WARFARIN SODIUM 5 MG PO TABS: ORAL_TABLET | ORAL | 3 refills | Status: DC

## 2016-12-31 MED ORDER — WARFARIN SODIUM 1 MG PO TABS: 1.0000 mg | ORAL_TABLET | Freq: Once | ORAL | 3 refills | Status: DC

## 2016-12-31 NOTE — Telephone Encounter (Signed)
Her last dose was 6 mg daily She is advised to take 6 mg daily  She should return for INR check to determine if dose adjustment is needed

## 2016-12-31 NOTE — Telephone Encounter (Signed)
Chart states that she is only on 6mg  per day. Please follow up.

## 2017-01-01 ENCOUNTER — Ambulatory Visit: Payer: Medicaid Other | Attending: Internal Medicine | Admitting: Pharmacist

## 2017-01-01 DIAGNOSIS — Z86718 Personal history of other venous thrombosis and embolism: Secondary | ICD-10-CM

## 2017-01-01 DIAGNOSIS — Z7901 Long term (current) use of anticoagulants: Principal | ICD-10-CM

## 2017-01-01 DIAGNOSIS — Z5181 Encounter for therapeutic drug level monitoring: Secondary | ICD-10-CM

## 2017-01-01 LAB — POCT INR: INR: 1.5

## 2017-01-02 NOTE — Telephone Encounter (Signed)
Pt was called and a VM was left informing pt to return phone call. 

## 2017-01-14 ENCOUNTER — Encounter (HOSPITAL_COMMUNITY): Payer: Self-pay | Admitting: *Deleted

## 2017-01-14 ENCOUNTER — Ambulatory Visit (HOSPITAL_COMMUNITY)
Admission: EM | Admit: 2017-01-14 | Discharge: 2017-01-14 | Disposition: A | Discharge status: Home / Self Care | Payer: Medicaid Other | Attending: Family Medicine | Admitting: Family Medicine

## 2017-01-14 DIAGNOSIS — Z79899 Other long term (current) drug therapy: Secondary | ICD-10-CM | POA: Insufficient documentation

## 2017-01-14 DIAGNOSIS — Z7901 Long term (current) use of anticoagulants: Secondary | ICD-10-CM | POA: Diagnosis not present

## 2017-01-14 DIAGNOSIS — R1030 Lower abdominal pain, unspecified: Secondary | ICD-10-CM | POA: Diagnosis not present

## 2017-01-14 DIAGNOSIS — Z86718 Personal history of other venous thrombosis and embolism: Secondary | ICD-10-CM | POA: Insufficient documentation

## 2017-01-14 DIAGNOSIS — Z8673 Personal history of transient ischemic attack (TIA), and cerebral infarction without residual deficits: Secondary | ICD-10-CM | POA: Insufficient documentation

## 2017-01-14 DIAGNOSIS — Z950 Presence of cardiac pacemaker: Secondary | ICD-10-CM | POA: Insufficient documentation

## 2017-01-14 LAB — POCT URINALYSIS DIP (DEVICE)
Bilirubin Urine: NEGATIVE
GLUCOSE, UA: NEGATIVE mg/dL
Hgb urine dipstick: NEGATIVE
Ketones, ur: NEGATIVE mg/dL
LEUKOCYTES UA: NEGATIVE
NITRITE: NEGATIVE
Protein, ur: NEGATIVE mg/dL
SPECIFIC GRAVITY, URINE: 1.015 (ref 1.005–1.030)
UROBILINOGEN UA: 0.2 mg/dL (ref 0.0–1.0)
pH: 6 (ref 5.0–8.0)

## 2017-01-14 LAB — POCT PREGNANCY, URINE: Preg Test, Ur: NEGATIVE

## 2017-01-14 MED ORDER — ONDANSETRON 8 MG PO TBDP: 8.0000 mg | ORAL_TABLET | Freq: Three times a day (TID) | ORAL | 0 refills | Status: DC | PRN

## 2017-01-14 NOTE — Discharge Instructions (Signed)
Labs do not show a urinary tract infection. We are running the other tests for other kinds of infection and they should be back tomorrow. In the meantime I've prescribed some medicine for you that should help

## 2017-01-14 NOTE — ED Triage Notes (Signed)
Lower  abd  Pain  X  1   Week     Denies   Any   Discharge  Or  Bleeding      Pt    Denies   Any  Nausea   Or  Vomiting        Pt  Ambulated  Yo  Room       Pt    Is   Sitting  Upright  On the  Exam table  In  No  Distress

## 2017-01-14 NOTE — ED Provider Notes (Signed)
[facility]    CSN: [id number] Arrival date & time: 01/14/17  1347     History   Chief Complaint Chief Complaint  Patient presents with  . Abdominal Pain    HPI Elizabeth Ballard is a 21 y.o. female.   Is a 21 year old woman who comes in with lower abdominal pain. She believes this is a urinary tract infection. She would like to be checked for STDs as well.  Patient has had sex only twice in her life, most recently being 1 week ago. She thinks she's not pregnant.      Past Medical History:  Diagnosis Date  . Acute deep vein thrombosis (DVT) (HCC) 08/27/2012 and 08/27/2008  . Heart block   . Presence of permanent cardiac pacemaker 1998   FOR CONGENITAL HEART BLOCK  . TIA (transient ischemic attack) 08/27/2013    Patient Active Problem List   Diagnosis Date Noted  . Encounter for monitoring coumadin therapy 08/07/2016  . Syncope and collapse   . Nonintractable headache   . Syncope 08/01/2016  . History of DVT (deep vein thrombosis) 08/01/2016  . Congenital heart block 08/01/2016  . Pacemaker 08/01/2016  . Chronic headaches 08/01/2016    Past Surgical History:  Procedure Laterality Date  . INSERT / REPLACE / REMOVE PACEMAKER  1998   CONGENITAL HEART BLOCK  . PACEMAKER INSERTION      OB History    No data available       Home Medications    Prior to Admission medications   Medication Sig Start Date End Date Taking? Authorizing Provider  atenolol (TENORMIN) 25 MG tablet Take 1 tablet (25 mg total) by mouth daily. 08/07/16   Dessa Phi, MD  warfarin (COUMADIN) 1 MG tablet Take 1 tablet (1 mg total) by mouth one time only at 6 PM. Take with 5 mg tablet to make total dose of 6 mg daily 12/31/16   Funches, Gerilyn Nestle, MD  warfarin (COUMADIN) 5 MG tablet TAKE 1 TABLET BY MOUTH DAILY AT 6 PM. TAKE WITH 1 MG TABLET TO MAKE A TOTAL OF 6 MG DAILY 12/31/16   Dessa Phi, MD    Family History History reviewed. No pertinent family history.  Social  History Social History  Substance Use Topics  . Smoking status: Never Smoker  . Smokeless tobacco: Never Used  . Alcohol use Yes     Allergies   Patient has no known allergies.   Review of Systems Review of Systems  Gastrointestinal: Positive for abdominal pain.  All other systems reviewed and are negative.    Physical Exam Triage Vital Signs ED Triage Vitals  Enc Vitals Group     BP 01/14/17 1426 120/64     Pulse Rate 01/14/17 1426 66     Resp 01/14/17 1426 18     Temp 01/14/17 1426 98.6 F (37 C)     Temp Source 01/14/17 1426 Oral     SpO2 01/14/17 1426 100 %     Weight --      Height --      Head Circumference --      Peak Flow --      Pain Score 01/14/17 1424 7     Pain Loc --      Pain Edu? --      Excl. in GC? --    No data found.   Updated Vital Signs BP 120/64 (BP Location: Right Arm)   Pulse 66   Temp 98.6 F (37 C) (Oral)  Resp 18   LMP 01/07/2017   SpO2 100%    Physical Exam  Constitutional: She is oriented to person, place, and time. She appears well-developed and well-nourished.  HENT:  Right Ear: External ear normal.  Left Ear: External ear normal.  Mouth/Throat: Oropharynx is clear and moist.  Eyes: Conjunctivae are normal. Pupils are equal, round, and reactive to light.  Neck: Normal range of motion. Neck supple.  Pulmonary/Chest: Effort normal.  Abdominal: Soft. She exhibits no mass. There is no tenderness. There is no guarding.  Musculoskeletal: Normal range of motion.  Neurological: She is alert and oriented to person, place, and time.  Skin: Skin is warm and dry.  Nursing note and vitals reviewed.    UC Treatments / Results  Labs (all labs ordered are listed, but only abnormal results are displayed) Labs Reviewed  POCT URINALYSIS DIP (DEVICE)  POCT PREGNANCY, URINE  URINE CYTOLOGY ANCILLARY ONLY    EKG  EKG Interpretation None       Radiology No results found.  Procedures Procedures (including critical care  time)  Medications Ordered in UC Medications - No data to display   Initial Impression / Assessment and Plan / UC Course  I have reviewed the triage vital signs and the nursing notes.  Pertinent labs & imaging results that were available during my care of the patient were reviewed by me and considered in my medical decision making (see chart for details).    Final Clinical Impressions(s) / UC Diagnoses   Final diagnoses:  Lower abdominal pain    New Prescriptions New Prescriptions   No medications on file     Elvina SidleLauenstein, Zhane Bluitt, MD 01/14/17 1516

## 2017-01-15 LAB — URINE CYTOLOGY ANCILLARY ONLY
Chlamydia: NEGATIVE
Neisseria Gonorrhea: NEGATIVE
Trichomonas: NEGATIVE

## 2017-01-17 ENCOUNTER — Encounter: Payer: Self-pay | Admitting: Family Medicine

## 2017-01-17 ENCOUNTER — Ambulatory Visit: Payer: Medicaid Other | Attending: Family Medicine | Admitting: Family Medicine

## 2017-01-17 VITALS — BP 107/72 | HR 74 | Temp 98.2°F | Wt 154.4 lb

## 2017-01-17 DIAGNOSIS — J301 Allergic rhinitis due to pollen: Secondary | ICD-10-CM

## 2017-01-17 DIAGNOSIS — Z86718 Personal history of other venous thrombosis and embolism: Secondary | ICD-10-CM | POA: Diagnosis not present

## 2017-01-17 DIAGNOSIS — Z113 Encounter for screening for infections with a predominantly sexual mode of transmission: Secondary | ICD-10-CM

## 2017-01-17 DIAGNOSIS — Z950 Presence of cardiac pacemaker: Secondary | ICD-10-CM | POA: Diagnosis not present

## 2017-01-17 DIAGNOSIS — Z8673 Personal history of transient ischemic attack (TIA), and cerebral infarction without residual deficits: Secondary | ICD-10-CM | POA: Diagnosis not present

## 2017-01-17 DIAGNOSIS — Z5181 Encounter for therapeutic drug level monitoring: Secondary | ICD-10-CM | POA: Diagnosis not present

## 2017-01-17 DIAGNOSIS — J302 Other seasonal allergic rhinitis: Secondary | ICD-10-CM | POA: Insufficient documentation

## 2017-01-17 DIAGNOSIS — Q246 Congenital heart block: Secondary | ICD-10-CM

## 2017-01-17 DIAGNOSIS — Z7901 Long term (current) use of anticoagulants: Secondary | ICD-10-CM | POA: Insufficient documentation

## 2017-01-17 LAB — URINE CYTOLOGY ANCILLARY ONLY
Bacterial vaginitis: POSITIVE — AB
Candida vaginitis: NEGATIVE

## 2017-01-17 LAB — POCT INR: INR: 1.3

## 2017-01-17 MED ORDER — WARFARIN SODIUM 1 MG PO TABS: ORAL_TABLET | ORAL | 3 refills | Status: DC

## 2017-01-17 MED ORDER — CETIRIZINE HCL 10 MG PO TABS: 10.0000 mg | ORAL_TABLET | Freq: Every day | ORAL | 11 refills | Status: AC

## 2017-01-17 NOTE — Assessment & Plan Note (Signed)
A: patient with subtherapeutic INR. P: Adjusted coumadin dose from 6 mg daily to 6 mg Tues and Thurs 7 mg all other days Referrals to cardiology and neurology to give recommendations regarding whether or not she truly needs lifelong anticoagulation and recommendations regarding whether or not she will be a candidate for NOVA

## 2017-01-17 NOTE — Patient Instructions (Addendum)
Sigurd Sosntonesia was seen today for anticoagulation.  Diagnoses and all orders for this visit:  History of DVT (deep vein thrombosis) -     INR -     warfarin (COUMADIN) 1 MG tablet; Take my mouth on Tuesday and Thursday take 1 mg with 5 mg coumadin for 6 mg dose, on all other days take 2 mg, with 5 mg coumadin for 7 mg  Congenital heart block -     Ambulatory referral to Cardiology  Pacemaker -     Ambulatory referral to Cardiology  Routine screening for STI (sexually transmitted infection) -     HIV antibody (with reflex) -     RPR  Seasonal allergic rhinitis due to pollen -     cetirizine (ZYRTEC) 10 MG tablet; Take 1 tablet (10 mg total) by mouth daily.   Lab Results  Component Value Date   INR 1.3 01/17/2017   INR 1.5 01/01/2017   INR 2.4 11/22/2016   Increase weekly coumadin dose by 5 mg  Take 6 mg on Tuesday and Thursday Take 7 mg all other days  F/u in one week at coumadin clinic for INR check   Dr. Armen PickupFunches

## 2017-01-17 NOTE — Progress Notes (Signed)
Subjective:  Patient ID: Elizabeth Ballard, female    DOB: 08/19/1996  Age: 21 y.o. MRN: [id number]  CC: Anticoagulation   HPI Elizabeth Ballard has hx of heart block has cardiac pacemaker, hx of TIA, hx of DVT x 2 on chronic coumadin she presents for   1. Coumadin check: she takes 6 mg of coumadin daily. She denies bleeding, bruising. Missed coumadin doses. She has seen hematology, Dr. Candise CheKale on 10/15/16,  who has recommended she have cardiology and neurology evaluation to determine if she truly needs long term anticoagulation or will be a good candidate for novel oral anticoagulation. She has has a negative thrombophilia work up so far.   Patient has a history of congenital heart block and required a permanent cardiac pacemaker placement at 903 days of age in [id number] and has had it since then.  Patient notes that she first had a left lower extremity DVT in 2010 which was thought to be provoked since she had a nexplanon implant which was subsequently removed. She was about 21 years old at the time and notes that she was treated with baby aspirin with resolution of the blood clot.   She subsequently had a clot in her permanent pacemaker wires at age 21 years and was again treated with aspirin.  She notes that she had TIAs at age 21 years and was started on Coumadin and was recommended to be on Coumadin for the rest of her life. It is unclear what the etiology of her TIAs were. She notes that she had presented with slurred speech and arm numbness.[name].  She was seen by pediatric cardiology, neurology as well as pediatric hematology oncology. She reports that she was under the care of Dr. Cleophas DunkerWhitfield [phone]. Pediatric hematology oncology who had recommended long-term anticoagulation.  2. Seasonal allergies: she is having some symptoms. Taking zyrtec. Request Rx.  3. STI screen: she denies symptoms. Went to urgent care and had negative urine cytology. No blood work done. She would like screening  for HIV.   Social History  Substance Use Topics  . Smoking status: Never Smoker  . Smokeless tobacco: Never Used  . Alcohol use Yes   Past Medical History:  Diagnosis Date  . Acute deep vein thrombosis (DVT) (HCC) 08/27/2012 and 08/27/2008  . Heart block   . Presence of permanent cardiac pacemaker 1998   FOR CONGENITAL HEART BLOCK  . TIA (transient ischemic attack) 08/27/2013    .mwdOutpatient Medications Prior to Visit  Medication Sig Dispense Refill  . atenolol (TENORMIN) 25 MG tablet Take 1 tablet (25 mg total) by mouth daily. 90 tablet 3  . ondansetron (ZOFRAN-ODT) 8 MG disintegrating tablet Take 1 tablet (8 mg total) by mouth every 8 (eight) hours as needed for nausea. 12 tablet 0  . warfarin (COUMADIN) 1 MG tablet Take 1 tablet (1 mg total) by mouth one time only at 6 PM. Take with 5 mg tablet to make total dose of 6 mg daily 90 tablet 3  . warfarin (COUMADIN) 5 MG tablet TAKE 1 TABLET BY MOUTH DAILY AT 6 PM. TAKE WITH 1 MG TABLET TO MAKE A TOTAL OF 6 MG DAILY 90 tablet 3   No facility-administered medications prior to visit.     ROS Review of Systems  Constitutional: Negative for chills and fever.  Eyes: Negative for visual disturbance.  Respiratory: Negative for shortness of breath.   Cardiovascular: Negative for chest pain.  Gastrointestinal: Negative for abdominal pain and blood in stool.  Musculoskeletal: Negative  for arthralgias and back pain.  Skin: Negative for rash.  Allergic/Immunologic: Negative for immunocompromised state.  Hematological: Negative for adenopathy. Does not bruise/bleed easily.  Psychiatric/Behavioral: Negative for dysphoric mood and suicidal ideas.    Objective:  BP 107/72   Pulse 74   Temp 98.2 F (36.8 C) (Oral)   Wt 154 lb 6.4 oz (70 kg)   LMP 01/07/2017   SpO2 99%   BMI 27.35 kg/m   BP/Weight 01/17/2017 01/14/2017 10/15/2016  Systolic BP 107 120 110  Diastolic BP 72 64 70  Wt. (Lbs) 154.4 - -  BMI 27.35 - -    Physical Exam    Constitutional: She is oriented to person, place, and time. She appears well-developed and well-nourished. No distress.  HENT:  Head: Normocephalic and atraumatic.  Cardiovascular: Normal rate, regular rhythm, normal heart sounds and intact distal pulses.   Pulmonary/Chest: Effort normal and breath sounds normal.  Musculoskeletal: She exhibits no edema.  Neurological: She is alert and oriented to person, place, and time.  Skin: Skin is warm and dry. No rash noted.  Psychiatric: She has a normal mood and affect.   Lab Results  Component Value Date   INR 1.3 01/17/2017   INR 1.5 01/01/2017   INR 2.4 11/22/2016     Assessment & Plan:  Jamel was seen today for anticoagulation.  Diagnoses and all orders for this visit:  History of DVT (deep vein thrombosis) -     INR -     warfarin (COUMADIN) 1 MG tablet; Take my mouth on Tuesday and Thursday take 1 mg with 5 mg coumadin for 6 mg dose, on all other days take 2 mg, with 5 mg coumadin for 7 mg  Congenital heart block -     Ambulatory referral to Cardiology  Pacemaker -     Ambulatory referral to Cardiology  Routine screening for STI (sexually transmitted infection) -     HIV antibody (with reflex) -     RPR  Seasonal allergic rhinitis due to pollen -     cetirizine (ZYRTEC) 10 MG tablet; Take 1 tablet (10 mg total) by mouth daily.  Hx of transient ischemic attack (TIA) -     Ambulatory referral to Neurology    No orders of the defined types were placed in this encounter.   Follow-up: Return in about 1 week (around 01/24/2017) for INR check .   Dessa Phi MD

## 2017-01-18 LAB — HIV ANTIBODY (ROUTINE TESTING W REFLEX): HIV Screen 4th Generation wRfx: NONREACTIVE

## 2017-01-18 LAB — RPR: RPR Ser Ql: NONREACTIVE

## 2017-01-22 ENCOUNTER — Telehealth: Payer: Self-pay

## 2017-01-22 DIAGNOSIS — B9689 Other specified bacterial agents as the cause of diseases classified elsewhere: Secondary | ICD-10-CM

## 2017-01-22 DIAGNOSIS — N760 Acute vaginitis: Principal | ICD-10-CM

## 2017-01-22 NOTE — Telephone Encounter (Signed)
[facility]. Will defer rx for BV to Dr. Armen PickupFunches

## 2017-01-22 NOTE — Telephone Encounter (Signed)
Pt running low on coumadin and would also like a script for her BV. Please f/u.

## 2017-01-23 ENCOUNTER — Telehealth: Payer: Self-pay

## 2017-01-23 NOTE — Telephone Encounter (Signed)
Pt was called and there was no answer.

## 2017-01-24 ENCOUNTER — Ambulatory Visit: Payer: Medicaid Other | Attending: Internal Medicine | Admitting: Pharmacist

## 2017-01-24 ENCOUNTER — Encounter: Payer: Medicaid Other | Admitting: Pharmacist

## 2017-01-24 DIAGNOSIS — Z7901 Long term (current) use of anticoagulants: Principal | ICD-10-CM

## 2017-01-24 DIAGNOSIS — Z86718 Personal history of other venous thrombosis and embolism: Secondary | ICD-10-CM

## 2017-01-24 DIAGNOSIS — Z5181 Encounter for therapeutic drug level monitoring: Secondary | ICD-10-CM

## 2017-01-24 LAB — POCT INR: INR: 1.9

## 2017-01-24 MED ORDER — METRONIDAZOLE 0.75 % VA GEL: 1.0000 | Freq: Two times a day (BID) | VAGINAL | 0 refills | Status: DC

## 2017-01-24 NOTE — Telephone Encounter (Signed)
Sent in metrogel for BV

## 2017-01-31 ENCOUNTER — Ambulatory Visit: Payer: Medicaid Other | Attending: Internal Medicine | Admitting: Pharmacist

## 2017-01-31 DIAGNOSIS — Z86718 Personal history of other venous thrombosis and embolism: Secondary | ICD-10-CM

## 2017-01-31 DIAGNOSIS — Z5181 Encounter for therapeutic drug level monitoring: Secondary | ICD-10-CM

## 2017-01-31 DIAGNOSIS — Z7901 Long term (current) use of anticoagulants: Principal | ICD-10-CM

## 2017-01-31 LAB — POCT INR: INR: 2.2

## 2017-01-31 MED ORDER — WARFARIN SODIUM 5 MG PO TABS: ORAL_TABLET | ORAL | 3 refills | Status: DC

## 2017-01-31 MED ORDER — WARFARIN SODIUM 1 MG PO TABS: ORAL_TABLET | ORAL | 3 refills | Status: DC

## 2017-02-21 ENCOUNTER — Ambulatory Visit (INDEPENDENT_AMBULATORY_CARE_PROVIDER_SITE_OTHER): Payer: Medicaid Other | Admitting: Internal Medicine

## 2017-02-21 ENCOUNTER — Encounter: Payer: Self-pay | Admitting: Internal Medicine

## 2017-02-21 ENCOUNTER — Encounter: Payer: Medicaid Other | Admitting: Pharmacist

## 2017-02-21 VITALS — BP 128/88 | HR 91 | Ht 63.0 in | Wt 158.4 lb

## 2017-02-21 DIAGNOSIS — R55 Syncope and collapse: Secondary | ICD-10-CM

## 2017-02-21 DIAGNOSIS — Z950 Presence of cardiac pacemaker: Secondary | ICD-10-CM

## 2017-02-21 DIAGNOSIS — Q246 Congenital heart block: Secondary | ICD-10-CM

## 2017-02-21 NOTE — Progress Notes (Signed)
      HPI [name] is referred today by Dr. Armen Ballard for ongoing evaluation and management of her PPM. She is a pleasant 21 yo woman with congenital CHB, s/p PPM who underwent recent device change out a year ago. In the interim she has done well. She denies chest pain, sob, or syncope. No edema. She has a remote DVT and is on warfarin. No Known Allergies   Current Outpatient Prescriptions  Medication Sig Dispense Refill  . cetirizine (ZYRTEC) 10 MG tablet Take 1 tablet (10 mg total) by mouth daily. 30 tablet 11  . metoprolol succinate (TOPROL-XL) 25 MG 24 hr tablet Take 25 mg by mouth daily.    [name]. warfarin (COUMADIN) 1 MG tablet Take my mouth on Tuesday and Thursday take 2 mg tabs with 5 mg coumadin for 7 mg dose, on all other days take 3 mg, with 5 mg coumadin for 8 mg 120 tablet 3  . warfarin (COUMADIN) 5 MG tablet TAKE 1 TABLET BY MOUTH DAILY AT 6 PM. TAKE WITH 1 MG TABLET TO MAKE A TOTAL OF 7-8 MG DAILY 90 tablet 3   No current facility-administered medications for this visit.      Past Medical History:  Diagnosis Date  . Acute deep vein thrombosis (DVT) (HCC) 08/27/2012 and 08/27/2008  . Heart block   . Presence of permanent cardiac pacemaker 1998   FOR CONGENITAL HEART BLOCK  . TIA (transient ischemic attack) 08/27/2013    ROS:   All systems reviewed and negative except as noted in the HPI.   Past Surgical History:  Procedure Laterality Date  . INSERT / REPLACE / REMOVE PACEMAKER  1998   CONGENITAL HEART BLOCK  . PACEMAKER INSERTION       No premature CAD  Social History   Social History  . Marital status: Single    Spouse name: N/A  . Number of children: N/A  . Years of education: N/A   Occupational History  . Not on file.   Social History Main Topics  . Smoking status: Never Smoker  . Smokeless tobacco: Never Used  . Alcohol use Yes  . Drug use: No  . Sexual activity: Not on file   Other Topics Concern  . Not on file   Social History Narrative    . No narrative on file     BP 128/88   Pulse 91   Ht 5\' 3"  (1.6 m)   Wt 158 lb 6.4 oz (71.8 kg)   SpO2 99%   BMI 28.06 kg/m   Physical Exam:  Well appearing young woman, NAD HEENT: Unremarkable Neck:  6 cm JVD, no thyromegally Lymphatics:  No adenopathy Back:  No CVA tenderness Lungs:  Clear with no wheezes, well healed PPM incision. HEART:  Regular rate rhythm, no murmurs, no rubs, no clicks Abd:  soft, positive bowel sounds, no organomegally, no rebound, no guarding Ext:  2 plus pulses, no edema, no cyanosis, no clubbing Skin:  No rashes no nodules Neuro:  CN II through XII intact, motor grossly intact  EKG - none today  DEVICE  Normal device function.  See PaceArt for details.   Assess/Plan: 1. CHB - she is stable s/p PPM insertion. No symptoms. 2. PPM - her medtronic device is working normally. Will recheck in several months.  3. DVT - she will continue on warfarin 4. TIA - none since starting warfarin.   Elizabeth Ballard,M.D.

## 2017-02-21 NOTE — Patient Instructions (Signed)
Medication Instructions:  Your physician recommends that you continue on your current medications as directed. Please refer to the Current Medication list given to you today.   Labwork: None Ordered   Testing/Procedures: None Ordered   Follow-Up: Your physician wants you to follow-up in: 1 year with Dr. Taylor. You will receive a reminder letter in the mail two months in advance. If you don't receive a letter, please call our office to schedule the follow-up appointment.  Remote monitoring is used to monitor your Pacemaker from home. This monitoring reduces the number of office visits required to check your device to one time per year. It allows us to keep an eye on the functioning of your device to ensure it is working properly. You are scheduled for a device check from home on  05/23/17 . You may send your transmission at any time that day. If you have a wireless device, the transmission will be sent automatically. After your physician reviews your transmission, you will receive a postcard with your next transmission date.    Any Other Special Instructions Will Be Listed Below (If Applicable).     If you need a refill on your cardiac medications before your next appointment, please call your pharmacy.   

## 2017-02-22 ENCOUNTER — Telehealth: Payer: Self-pay | Admitting: Internal Medicine

## 2017-02-22 NOTE — Telephone Encounter (Signed)
[facility]

## 2017-02-22 NOTE — Telephone Encounter (Signed)
[facility] Placed in chart prep.

## 2017-02-25 ENCOUNTER — Telehealth: Payer: Self-pay

## 2017-02-25 NOTE — Telephone Encounter (Signed)
SENT NOTES TO SCHEDULING 

## 2017-03-13 ENCOUNTER — Ambulatory Visit: Payer: Medicaid Other | Attending: Internal Medicine | Admitting: Pharmacist

## 2017-03-13 DIAGNOSIS — R109 Unspecified abdominal pain: Secondary | ICD-10-CM

## 2017-03-13 DIAGNOSIS — Z86718 Personal history of other venous thrombosis and embolism: Secondary | ICD-10-CM

## 2017-03-13 DIAGNOSIS — Z5181 Encounter for therapeutic drug level monitoring: Secondary | ICD-10-CM | POA: Diagnosis not present

## 2017-03-13 DIAGNOSIS — Z7901 Long term (current) use of anticoagulants: Secondary | ICD-10-CM | POA: Diagnosis not present

## 2017-03-13 LAB — POCT URINALYSIS DIPSTICK
Bilirubin, UA: NEGATIVE
Blood, UA: NEGATIVE
GLUCOSE UA: NEGATIVE
Ketones, UA: NEGATIVE
Leukocytes, UA: NEGATIVE
Nitrite, UA: NEGATIVE
PROTEIN UA: NEGATIVE
Spec Grav, UA: >=1.03 — AB (ref 1.010–1.025)
UROBILINOGEN UA: 1 U/dL
pH, UA: 6 (ref 5.0–8.0)

## 2017-03-13 LAB — POCT URINE PREGNANCY: PREG TEST UR: NEGATIVE

## 2017-03-13 LAB — POCT INR: INR: 1.4

## 2017-03-13 NOTE — Addendum Note (Signed)
Addended by: Guy FrancoBENJAMIN, General Wearing on: 03/13/2017 04:08 PM   Modules accepted: Orders

## 2017-03-13 NOTE — Progress Notes (Addendum)
Pt c/o abdominal cramping. She has concerns for UTI and request urinalysis to r/o.    LMP- 02/07/2017. Urine HCG: neg Informed cramping maybe due to ovulation. Instructed to call for appointment if sx's progress or does improve. Pt verbalized understanding.

## 2017-03-20 ENCOUNTER — Telehealth: Payer: Self-pay

## 2017-03-20 ENCOUNTER — Ambulatory Visit: Payer: Medicaid Other | Attending: Internal Medicine | Admitting: Pharmacist

## 2017-03-20 ENCOUNTER — Ambulatory Visit: Payer: Medicaid Other | Admitting: Neurology

## 2017-03-20 DIAGNOSIS — Z7901 Long term (current) use of anticoagulants: Secondary | ICD-10-CM | POA: Diagnosis present

## 2017-03-20 DIAGNOSIS — Z86718 Personal history of other venous thrombosis and embolism: Secondary | ICD-10-CM

## 2017-03-20 DIAGNOSIS — Z79899 Other long term (current) drug therapy: Secondary | ICD-10-CM | POA: Diagnosis present

## 2017-03-20 DIAGNOSIS — Z5181 Encounter for therapeutic drug level monitoring: Secondary | ICD-10-CM

## 2017-03-20 LAB — POCT INR: INR: 1.9

## 2017-03-20 NOTE — Progress Notes (Signed)
[facility] but got [facility]. Has to take the bus so has no direct transportation there now. Directions provided and instructed patient to call [facility] office to let them know she was running late or that she needed to reschedule/

## 2017-03-20 NOTE — Telephone Encounter (Signed)
Pt no-showed her new pt appt. 

## 2017-03-25 ENCOUNTER — Encounter: Payer: Self-pay | Admitting: Neurology

## 2017-04-03 ENCOUNTER — Encounter: Payer: Medicaid Other | Admitting: Pharmacist

## 2017-05-15 ENCOUNTER — Ambulatory Visit (INDEPENDENT_AMBULATORY_CARE_PROVIDER_SITE_OTHER): Payer: Medicaid Other

## 2017-05-15 ENCOUNTER — Ambulatory Visit (HOSPITAL_COMMUNITY)
Admission: EM | Admit: 2017-05-15 | Discharge: 2017-05-15 | Disposition: A | Discharge status: Home / Self Care | Payer: Medicaid Other | Attending: Family Medicine | Admitting: Family Medicine

## 2017-05-15 ENCOUNTER — Encounter (HOSPITAL_COMMUNITY): Payer: Self-pay | Admitting: Family Medicine

## 2017-05-15 DIAGNOSIS — Z86718 Personal history of other venous thrombosis and embolism: Secondary | ICD-10-CM | POA: Diagnosis not present

## 2017-05-15 DIAGNOSIS — M79671 Pain in right foot: Secondary | ICD-10-CM | POA: Diagnosis not present

## 2017-05-15 DIAGNOSIS — S93601A Unspecified sprain of right foot, initial encounter: Secondary | ICD-10-CM

## 2017-05-15 DIAGNOSIS — Z8673 Personal history of transient ischemic attack (TIA), and cerebral infarction without residual deficits: Secondary | ICD-10-CM | POA: Diagnosis not present

## 2017-05-15 LAB — PROTIME-INR
INR: 1.16
Prothrombin Time: 14.7 seconds (ref 11.4–15.2)

## 2017-05-15 NOTE — ED Provider Notes (Signed)
  [facility]   [id number] 05/15/17 Arrival Time: 1127  ASSESSMENT & PLAN:  1. Foot pain, right    No bony abnormality seen on x-rays today. OTC ibuprofen with food as needed; supportive shoe. F/U one week if not improving.  Reviewed expectations re: course of current medical issues. Questions answered. Outlined signs and symptoms indicating need for more acute intervention. Patient verbalized understanding. After Visit Summary given.   SUBJECTIVE:  Elizabeth Ballard is a 21 y.o. female who presents with complaint of R foot pain for the past 3 days. Gradual onset after running to her bus stop one morning. No direct trauma. Able to bear weight. Worse after prolonged walking/standing. No swelling or bruising. No sensation changes. No previous injury reported. No OTC analgesics needed/taken. Pain slightly worsening.  ROS: As per HPI.   OBJECTIVE:  Vitals:   05/15/17 1229  BP: 136/80  Pulse: 60  Resp: 16  Temp: 98.3 F (36.8 C)  TempSrc: Oral  SpO2: 100%    General appearance: alert; no distress Extremities: R foot without gross abnormalities; tender over proximal 5th metatarsal; no swelling or bruising; normal sensation Skin: warm and dry Neurologic: normal gait Psychological: alert and cooperative; normal mood and affect  Imaging: Dg Foot Complete Right  Result Date: 05/15/2017 CLINICAL DATA:  Mid lateral right foot pain for 3 days after running. EXAM: RIGHT FOOT COMPLETE - 3+ VIEW COMPARISON:  None. FINDINGS: The mineralization and alignment are normal. There is no evidence of acute fracture or dislocation. The joint spaces are maintained. No focal soft tissue swelling identified. IMPRESSION: No acute osseous findings. Electronically Signed   By: Carey Bullocks M.D.   On: 05/15/2017 13:21   No Known Allergies  Past Medical History:  Diagnosis Date  . Acute deep vein thrombosis (DVT) (HCC) 08/27/2012 and 08/27/2008  . Heart block   . Presence of permanent  cardiac pacemaker 1998   FOR CONGENITAL HEART BLOCK  . TIA (transient ischemic attack) 08/27/2013   Social History   Social History  . Marital status: Single    Spouse name: N/A  . Number of children: N/A  . Years of education: N/A   Occupational History  . Not on file.   Social History Main Topics  . Smoking status: Never Smoker  . Smokeless tobacco: Never Used  . Alcohol use Yes  . Drug use: No  . Sexual activity: Not on file   Other Topics Concern  . Not on file   Social History Narrative  . No narrative on file    Past Surgical History:  Procedure Laterality Date  . INSERT / REPLACE / REMOVE PACEMAKER  1998   CONGENITAL HEART BLOCK  . PACEMAKER INSERTION       Mardella Layman, MD 05/18/17 0730

## 2017-05-15 NOTE — Discharge Instructions (Signed)
You may use over the counter ibuprofen or acetaminophen as needed.  ° °

## 2017-05-15 NOTE — ED Triage Notes (Signed)
Pt here for 2 days of right lateral foot pain. No specific injury

## 2017-05-22 ENCOUNTER — Telehealth: Payer: Self-pay | Admitting: Family Medicine

## 2017-05-22 DIAGNOSIS — Z86718 Personal history of other venous thrombosis and embolism: Secondary | ICD-10-CM

## 2017-05-22 MED ORDER — WARFARIN SODIUM 5 MG PO TABS: ORAL_TABLET | ORAL | 0 refills | Status: DC

## 2017-05-22 NOTE — Telephone Encounter (Signed)
Patient called requesting medication refill on warfarin (COUMADIN) 1 MG tablet, pt states she has not been taking medication. Patient is currently at the pharmacy and is requesting to see if medication can be sent today. Please f/up

## 2017-05-22 NOTE — Telephone Encounter (Signed)
Refilled x 30 days but patient must have office visit for any further refills.

## 2017-05-23 ENCOUNTER — Ambulatory Visit: Payer: Medicaid Other | Attending: Internal Medicine | Admitting: Pharmacist

## 2017-05-23 ENCOUNTER — Encounter: Payer: Medicaid Other | Admitting: *Deleted

## 2017-05-23 DIAGNOSIS — Z5181 Encounter for therapeutic drug level monitoring: Secondary | ICD-10-CM

## 2017-05-23 DIAGNOSIS — Z7901 Long term (current) use of anticoagulants: Secondary | ICD-10-CM

## 2017-05-23 DIAGNOSIS — Z86718 Personal history of other venous thrombosis and embolism: Secondary | ICD-10-CM | POA: Diagnosis present

## 2017-05-23 LAB — POCT INR: INR: 1.3

## 2017-05-23 NOTE — Progress Notes (Signed)
Patient has been off warfarin for a few days since she did not follow up with [place] and did not have refills. Picked it up last night and restarted it. Stressed the importance of regular follow up and INR monitoring to prevent bleeds or future clots.  Encouraged patient to make follow up appt with neurology as she was a no show for the last visit with them.  She also needs to establish with new PCP here.

## 2017-05-30 ENCOUNTER — Encounter: Payer: Medicaid Other | Admitting: Pharmacist

## 2017-06-04 ENCOUNTER — Encounter: Payer: Medicaid Other | Admitting: Pharmacist

## 2017-06-12 ENCOUNTER — Telehealth: Payer: Self-pay | Admitting: Cardiology

## 2017-06-12 ENCOUNTER — Encounter: Payer: Medicaid Other | Admitting: *Deleted

## 2017-06-12 NOTE — Telephone Encounter (Signed)
LMOVM reminding pt to send remote transmission.   

## 2017-06-14 ENCOUNTER — Encounter: Payer: Self-pay | Admitting: Cardiology

## 2017-07-05 ENCOUNTER — Ambulatory Visit (INDEPENDENT_AMBULATORY_CARE_PROVIDER_SITE_OTHER): Payer: Medicaid Other | Admitting: *Deleted

## 2017-07-05 DIAGNOSIS — R55 Syncope and collapse: Secondary | ICD-10-CM

## 2017-07-05 DIAGNOSIS — Z950 Presence of cardiac pacemaker: Secondary | ICD-10-CM

## 2017-07-05 DIAGNOSIS — Q246 Congenital heart block: Secondary | ICD-10-CM

## 2017-07-08 NOTE — Progress Notes (Signed)
Remote pacemaker transmission.   

## 2017-07-09 LAB — CUP PACEART REMOTE DEVICE CHECK
Battery Remaining Longevity: 87 mo
Brady Statistic AP VS Percent: 0 %
Brady Statistic AS VP Percent: 99.84 %
Brady Statistic AS VS Percent: 0 %
Date Time Interrogation Session: 20181109194913
Implantable Lead Implant Date: 20070109
Implantable Lead Location: 753860
Lead Channel Pacing Threshold Amplitude: 0.625 V
Lead Channel Pacing Threshold Pulse Width: 0.4 ms
Lead Channel Pacing Threshold Pulse Width: 0.4 ms
Lead Channel Sensing Intrinsic Amplitude: 21.625 mV
Lead Channel Sensing Intrinsic Amplitude: 4.625 mV
Lead Channel Sensing Intrinsic Amplitude: 4.625 mV
MDC IDC LEAD IMPLANT DT: 20070109
MDC IDC LEAD LOCATION: 753859
MDC IDC MSMT BATTERY VOLTAGE: 3.01 V
MDC IDC MSMT LEADCHNL RA IMPEDANCE VALUE: 342 Ohm
MDC IDC MSMT LEADCHNL RA IMPEDANCE VALUE: 418 Ohm
MDC IDC MSMT LEADCHNL RV IMPEDANCE VALUE: 380 Ohm
MDC IDC MSMT LEADCHNL RV IMPEDANCE VALUE: 475 Ohm
MDC IDC MSMT LEADCHNL RV PACING THRESHOLD AMPLITUDE: 1.125 V
MDC IDC MSMT LEADCHNL RV SENSING INTR AMPL: 21.625 mV
MDC IDC PG IMPLANT DT: 20160211
MDC IDC SET LEADCHNL RA PACING AMPLITUDE: 1.5 V
MDC IDC SET LEADCHNL RV PACING AMPLITUDE: 2.25 V
MDC IDC SET LEADCHNL RV PACING PULSEWIDTH: 0.4 ms
MDC IDC SET LEADCHNL RV SENSING SENSITIVITY: 0.9 mV
MDC IDC STAT BRADY AP VP PERCENT: 0.15 %
MDC IDC STAT BRADY RA PERCENT PACED: 0.15 %
MDC IDC STAT BRADY RV PERCENT PACED: 100 %

## 2017-07-11 ENCOUNTER — Encounter: Payer: Self-pay | Admitting: Cardiology

## 2017-09-12 ENCOUNTER — Ambulatory Visit: Payer: Medicaid Other | Attending: Family Medicine | Admitting: Family Medicine

## 2017-09-12 ENCOUNTER — Ambulatory Visit: Payer: Medicaid Other | Admitting: Pharmacist

## 2017-09-12 ENCOUNTER — Ambulatory Visit: Payer: Medicaid Other | Attending: Family Medicine | Admitting: Pharmacist

## 2017-09-12 ENCOUNTER — Encounter: Payer: Self-pay | Admitting: Family Medicine

## 2017-09-12 VITALS — BP 119/76 | HR 61 | Temp 98.2°F | Resp 18 | Ht 63.0 in | Wt 168.0 lb

## 2017-09-12 DIAGNOSIS — Z950 Presence of cardiac pacemaker: Secondary | ICD-10-CM | POA: Insufficient documentation

## 2017-09-12 DIAGNOSIS — Z5181 Encounter for therapeutic drug level monitoring: Secondary | ICD-10-CM

## 2017-09-12 DIAGNOSIS — Z7901 Long term (current) use of anticoagulants: Secondary | ICD-10-CM

## 2017-09-12 DIAGNOSIS — Z86718 Personal history of other venous thrombosis and embolism: Secondary | ICD-10-CM | POA: Insufficient documentation

## 2017-09-12 DIAGNOSIS — Q246 Congenital heart block: Secondary | ICD-10-CM | POA: Insufficient documentation

## 2017-09-12 DIAGNOSIS — Z79899 Other long term (current) drug therapy: Secondary | ICD-10-CM | POA: Diagnosis not present

## 2017-09-12 LAB — POCT INR: INR: 1.3

## 2017-09-12 MED ORDER — WARFARIN SODIUM 1 MG PO TABS: ORAL_TABLET | ORAL | 0 refills | Status: DC

## 2017-09-12 MED ORDER — WARFARIN SODIUM 5 MG PO TABS: ORAL_TABLET | ORAL | 0 refills | Status: DC

## 2017-09-12 NOTE — Progress Notes (Signed)
   Subjective:  Patient ID: Elizabeth Ballard, female    DOB: December 15, 1995  Age: 22 y.o. MRN: [id number]  CC: Establish Care   HPI Elizabeth Ballard presents to establish care. History of congential CHB and is status post percutaneous pacemaker. She is being followed by cardiology. She has remote monitoring on pacemaker . She reports being on Coumadin therapy for almost 3 years. She denies any bruising, hematuria, or hematochezia, or melena. History of syncopal episode at rest in October. She denies any history of seizure disorder. She was referred to neurology. She reports not following up. No additional episodes since. She expresses she and her mother have concerns that patient will lose her medicare coverage once she is 36 and will only receive family planning. She reports her mother has made attempts in the past to get coverage for patient but has been unsuccessful.     Outpatient Medications Prior to Visit  Medication Sig Dispense Refill  . cetirizine (ZYRTEC) 10 MG tablet Take 1 tablet (10 mg total) by mouth daily. 30 tablet 11  . metoprolol succinate (TOPROL-XL) 25 MG 24 hr tablet Take 25 mg by mouth daily.    [name] warfarin (COUMADIN) 1 MG tablet Take one '5mg'$  tablet and two 1 mg daily (7 mg total) daily but 8 mg total on Tuesdays, Wednesdays, and Thursdays. 30 tablet 0   No facility-administered medications prior to visit.     ROS Review of Systems  Constitutional: Negative.   Respiratory: Negative.   Cardiovascular: Negative.        History of pacemaker  Gastrointestinal: Negative.  Negative for blood in stool.  Skin: Negative.   Neurological: Negative for syncope.  Psychiatric/Behavioral: Negative.     Objective:  BP 119/76 (BP Location: Left Arm, Patient Position: Sitting, Cuff Size: Normal)   Pulse 61   Temp 98.2 F (36.8 C) (Oral)   Resp 18   Ht '5\' 3"'$  (1.6 m)   Wt 168 lb (76.2 kg)   LMP 08/17/2017   SpO2 98%   BMI 29.76 kg/m   BP/Weight 09/12/2017 05/15/2017  02/14/9470  Systolic BP 252 712 929  Diastolic BP 76 80 88  Wt. (Lbs) 168 - 158.4  BMI 29.76 - 28.06     Physical Exam  Constitutional: She appears well-developed and well-nourished.  Cardiovascular: Normal rate, regular rhythm, normal heart sounds and intact distal pulses.  Pacemaker to left upper chest wall.  Pulmonary/Chest: Effort normal and breath sounds normal.  Abdominal: Soft. Bowel sounds are normal.  Skin: Skin is warm and dry.  Psychiatric: She has a normal mood and affect.  Nursing note and vitals reviewed.    Assessment & Plan:   1. Congenital heart block LCSW spoke with patient. Provided information and application for legal aid.   2. History of DVT (deep vein thrombosis) Met with clinical pharmacist for PT/INR and anticoagulation dosage.  3. Warfarin anticoagulation       Follow-up: Return in about 7 days (around 09/19/2017) for Anti-coagulation with St[facility].   Alfonse Spruce FNP

## 2017-09-19 ENCOUNTER — Ambulatory Visit: Payer: Medicaid Other | Attending: Family Medicine | Admitting: Pharmacist

## 2017-09-19 DIAGNOSIS — Z86718 Personal history of other venous thrombosis and embolism: Secondary | ICD-10-CM | POA: Insufficient documentation

## 2017-09-19 DIAGNOSIS — Z5181 Encounter for therapeutic drug level monitoring: Secondary | ICD-10-CM | POA: Diagnosis not present

## 2017-09-19 DIAGNOSIS — Z7901 Long term (current) use of anticoagulants: Secondary | ICD-10-CM

## 2017-09-19 LAB — POCT INR: INR: 1.3

## 2017-09-26 ENCOUNTER — Encounter: Payer: Medicaid Other | Admitting: Pharmacist

## 2017-10-07 ENCOUNTER — Encounter: Payer: Medicaid Other | Admitting: *Deleted

## 2017-10-07 ENCOUNTER — Telehealth: Payer: Self-pay | Admitting: Cardiology

## 2017-10-07 NOTE — Telephone Encounter (Signed)
Attempted to confirm remote transmission with pt. No answer and was unable to leave a message. Automated message stating that the call could not be completed as dialed.  

## 2017-10-09 ENCOUNTER — Encounter: Payer: Self-pay | Admitting: Cardiology

## 2017-10-30 ENCOUNTER — Telehealth: Payer: Self-pay

## 2017-10-30 NOTE — Telephone Encounter (Signed)
Message received from [facility], [facility] scheduler noting the patient was requesting a call back.  Call placed to the patient # [phone] who stated that she is concerned about loosing her medicaid when she turns 22 in April 2019.  She also had questions about applying for disability.  This CM explained to her her about the services offered by [employer] [place]. There is no guarantee that they can help her but they may be able to provide her with resources that can assist her.  This CM explained that she will need to sign a consent for the referral to legal aid and she said that she can stop by the office and complete that document.

## 2017-12-03 ENCOUNTER — Telehealth: Payer: Self-pay | Admitting: Pharmacist

## 2017-12-03 NOTE — Telephone Encounter (Signed)
Spoke to patient in order to schedule an INR appointment for her Coumadin. Was able to route her to scheduling to get an appointment. Additionally, I advised her to re-establish care with a new PCP since [facility].

## 2017-12-04 ENCOUNTER — Encounter: Payer: Medicaid Other | Admitting: Pharmacist

## 2017-12-30 DIAGNOSIS — Z86718 Personal history of other venous thrombosis and embolism: Secondary | ICD-10-CM

## 2018-01-09 ENCOUNTER — Ambulatory Visit: Payer: Medicaid Other | Admitting: Internal Medicine

## 2018-01-22 ENCOUNTER — Telehealth: Payer: Self-pay

## 2018-01-22 ENCOUNTER — Ambulatory Visit: Payer: Self-pay | Attending: Family Medicine | Admitting: Pharmacist

## 2018-01-22 DIAGNOSIS — Z86718 Personal history of other venous thrombosis and embolism: Secondary | ICD-10-CM

## 2018-01-22 DIAGNOSIS — Z5181 Encounter for therapeutic drug level monitoring: Secondary | ICD-10-CM

## 2018-01-22 DIAGNOSIS — Z7901 Long term (current) use of anticoagulants: Secondary | ICD-10-CM | POA: Insufficient documentation

## 2018-01-22 DIAGNOSIS — Z9114 Patient's other noncompliance with medication regimen: Secondary | ICD-10-CM | POA: Insufficient documentation

## 2018-01-22 LAB — POCT INR: INR: 1 — AB (ref 2.0–3.0)

## 2018-01-22 NOTE — Progress Notes (Addendum)
Pt seen today for INR monitoring/Coumadin dosing. She has a history of non-compliance keeping visits and not taking her medications. Per her charts, she is followed by Cardiology for CHB but reports not seeing them in some time. Additionally, she has not been seen by a PCP here since [facility] in Jan., 2019.    Reinforced to the patient the importance of taking Coumadin, keeping INR check visits, and keeping provider visits. She has a history of VTE with a complicated cardiac history. Her risk of clotting was emphasized. Pt reports taking warfarin different than what is documented in her schedule. She reports taking  daily instead of .  I adjusted her warfarin today as described in the encounter and will see her back in 1 week.   Pt verbalizes understanding of the plan and was amenable to keeping future appointments. Her scheduling plays a big factor in her availability, so I told her to reach out to me in the event that she can't make an appointment.   Butch Penny, PharmD Clinical Pharmacist  [facility]  [phone]

## 2018-01-22 NOTE — Telephone Encounter (Signed)
Patient completed a referral for Legal Aid assistance. Completed application faxed to Ranee Gosselin, [employer] [place].

## 2018-01-29 ENCOUNTER — Telehealth: Payer: Self-pay

## 2018-01-29 ENCOUNTER — Ambulatory Visit: Payer: Self-pay | Attending: Family Medicine | Admitting: Pharmacist

## 2018-01-29 DIAGNOSIS — Z5181 Encounter for therapeutic drug level monitoring: Secondary | ICD-10-CM | POA: Insufficient documentation

## 2018-01-29 DIAGNOSIS — Z7901 Long term (current) use of anticoagulants: Secondary | ICD-10-CM | POA: Insufficient documentation

## 2018-01-29 DIAGNOSIS — Z86718 Personal history of other venous thrombosis and embolism: Secondary | ICD-10-CM | POA: Insufficient documentation

## 2018-01-29 LAB — POCT INR: INR: 1.1 — AB (ref 2.0–3.0)

## 2018-01-29 NOTE — Telephone Encounter (Signed)
As per Elizabeth GosselinMaria Ramirez Ballard, [employer] [place], they have left voicemail messages for the patient but have not been able to speak to her yet.

## 2018-02-04 ENCOUNTER — Ambulatory Visit (INDEPENDENT_AMBULATORY_CARE_PROVIDER_SITE_OTHER): Payer: Self-pay | Admitting: *Deleted

## 2018-02-04 ENCOUNTER — Telehealth: Payer: Self-pay | Admitting: Cardiology

## 2018-02-04 ENCOUNTER — Encounter: Payer: Self-pay | Admitting: *Deleted

## 2018-02-04 DIAGNOSIS — R55 Syncope and collapse: Secondary | ICD-10-CM

## 2018-02-04 NOTE — Telephone Encounter (Signed)
Spoke w/ pt and she wanted to know if she could take an exercise class. She stated that she would need documentation from our office stating that she is ok to take the exercise class. Call routed to Device Tech RN

## 2018-02-04 NOTE — Telephone Encounter (Signed)
[name] needs a letter sent to [facility] fitness class instructor clearing her to participate in a fitness class regarding her permanent pacemaker. See form letter. Normal device function on remote transmission 02/04/18.  Scheduled to see Dr. Ladona Ridgelaylor 02/21/18. Letter faxed to [name] [phone].

## 2018-02-05 ENCOUNTER — Encounter: Payer: Self-pay | Admitting: Cardiology

## 2018-02-05 ENCOUNTER — Telehealth: Payer: Self-pay | Admitting: Pharmacist

## 2018-02-05 ENCOUNTER — Encounter: Payer: Self-pay | Admitting: Pharmacist

## 2018-02-05 NOTE — Telephone Encounter (Signed)
PC placed this AM at 10:15. Pt missed INR check today and her Coumadin was adjusted last week. I called to check on her. She states that she's okay but had some scheduling conflicts that came up with school. I told her I can see her later today and she was agreeable. States that she will call me after classes at 2:00 PM to let me know if she can make it. Emphasized the importance of coming in and pt verbalized understanding. Left her with my extension.   Butch PennyLuke Van Ausdall, PharmD, CPP Clinical Pharmacist  [facility]  [phone]

## 2018-02-05 NOTE — Progress Notes (Signed)
Remote pacemaker transmission.   

## 2018-02-10 LAB — CUP PACEART REMOTE DEVICE CHECK
Brady Statistic AP VS Percent: 0 %
Brady Statistic AS VP Percent: 99.94 %
Brady Statistic AS VS Percent: 0 %
Brady Statistic RA Percent Paced: 0.05 %
Brady Statistic RV Percent Paced: 100 %
Implantable Lead Implant Date: 20070109
Implantable Lead Location: 753860
Implantable Lead Model: 5076
Lead Channel Impedance Value: 342 Ohm
Lead Channel Impedance Value: 399 Ohm
Lead Channel Pacing Threshold Amplitude: 1 V
Lead Channel Pacing Threshold Pulse Width: 0.4 ms
Lead Channel Pacing Threshold Pulse Width: 0.4 ms
Lead Channel Sensing Intrinsic Amplitude: 14.75 mV
Lead Channel Sensing Intrinsic Amplitude: 14.75 mV
Lead Channel Sensing Intrinsic Amplitude: 3.75 mV
Lead Channel Setting Pacing Pulse Width: 0.4 ms
MDC IDC LEAD IMPLANT DT: 20070109
MDC IDC LEAD LOCATION: 753859
MDC IDC MSMT BATTERY REMAINING LONGEVITY: 78 mo
MDC IDC MSMT BATTERY VOLTAGE: 3.01 V
MDC IDC MSMT LEADCHNL RA PACING THRESHOLD AMPLITUDE: 0.625 V
MDC IDC MSMT LEADCHNL RA SENSING INTR AMPL: 3.75 mV
MDC IDC MSMT LEADCHNL RV IMPEDANCE VALUE: 380 Ohm
MDC IDC MSMT LEADCHNL RV IMPEDANCE VALUE: 475 Ohm
MDC IDC PG IMPLANT DT: 20160211
MDC IDC SESS DTM: 20190611203217
MDC IDC SET LEADCHNL RA PACING AMPLITUDE: 1.5 V
MDC IDC SET LEADCHNL RV PACING AMPLITUDE: 2 V
MDC IDC SET LEADCHNL RV SENSING SENSITIVITY: 0.9 mV
MDC IDC STAT BRADY AP VP PERCENT: 0.05 %

## 2018-02-18 ENCOUNTER — Other Ambulatory Visit: Payer: Self-pay | Admitting: Pharmacist

## 2018-02-18 ENCOUNTER — Other Ambulatory Visit: Payer: Self-pay | Admitting: Internal Medicine

## 2018-02-18 DIAGNOSIS — Z86718 Personal history of other venous thrombosis and embolism: Secondary | ICD-10-CM

## 2018-02-18 MED ORDER — WARFARIN SODIUM 1 MG PO TABS: ORAL_TABLET | ORAL | 0 refills | Status: DC

## 2018-02-18 MED ORDER — WARFARIN SODIUM 5 MG PO TABS: ORAL_TABLET | ORAL | 0 refills | Status: DC

## 2018-02-18 NOTE — Progress Notes (Signed)
Refills placed for Coumadin for patient. Gave her enough to last her until Friday. I will see her at that time and adjust her warfarin as needed.   Butch PennyLuke Van Ausdall, PharmD, CPP Clinical Pharmacist [facility] [phone]

## 2018-02-21 ENCOUNTER — Encounter: Payer: Self-pay | Admitting: Pharmacist

## 2018-02-21 ENCOUNTER — Telehealth: Payer: Self-pay | Admitting: Internal Medicine

## 2018-02-21 ENCOUNTER — Encounter: Payer: Self-pay | Admitting: Internal Medicine

## 2018-02-21 ENCOUNTER — Ambulatory Visit: Payer: Self-pay | Attending: Family Medicine | Admitting: Pharmacist

## 2018-02-21 ENCOUNTER — Ambulatory Visit: Payer: Self-pay | Admitting: Internal Medicine

## 2018-02-21 DIAGNOSIS — Z7901 Long term (current) use of anticoagulants: Secondary | ICD-10-CM | POA: Insufficient documentation

## 2018-02-21 DIAGNOSIS — Z5181 Encounter for therapeutic drug level monitoring: Secondary | ICD-10-CM | POA: Insufficient documentation

## 2018-02-21 DIAGNOSIS — Z86718 Personal history of other venous thrombosis and embolism: Secondary | ICD-10-CM | POA: Insufficient documentation

## 2018-02-21 LAB — POCT INR: INR: 1 — AB (ref 2.0–3.0)

## 2018-02-21 MED ORDER — WARFARIN SODIUM 10 MG PO TABS: 10.0000 mg | ORAL_TABLET | Freq: Every day | ORAL | 0 refills | Status: DC

## 2018-02-21 NOTE — Telephone Encounter (Signed)
New Message:    Pt is unable to make today's appt. She wants you to call her when you have time please.

## 2018-02-21 NOTE — Telephone Encounter (Signed)
Returned call- no answer.  Sent message via MyChart. Await response.

## 2018-02-21 NOTE — Patient Instructions (Signed)
Thank you for coming to see me today. Please start taking 10 mg of warfarin daily until I see you again Tuesday.

## 2018-02-21 NOTE — Progress Notes (Signed)
    Pharmacy Anticoagulation Clinic  Subjective: Patient presents today for INR monitoring. Anticoagulation indication is history of DVT and anticoagulation monitoring for long-term.   Current dose of warfarin: - 8 mg daily   Adherence to warfarin: no - patient has history of non-compliance. Often forgets to take medication and misses INR visits without follow-up. After questioning, patient reveals that stress from school and work makes it hard to remember to take medication.   Signs/symptoms of bleeding: no Recent changes in diet: no Recent changes in medications: no Upcoming procedures that may impact anticoagulation: none  Objective: Today's INR = 1.0  Lab Results  Component Value Date   INR 1.1 (A) 01/29/2018   INR 1.0 (A) 01/22/2018   INR 1.3 09/19/2017     Assessment and Plan: Anticoagulation: Patient is Subtherapeutic based on patient's INR of 1.0 and patient's INR goal of 2-3. Will Increase current warfarin dose: 10 mg daily.   After lengthy discussion with patient, she states that she is agreeable to taking warfarin daily and will try to increase adherence. Risk/benefit discussed. Emphasized significance of subtherapeutic INR and clotting. Emphasized that clotting could be fatal. Discussed the idea of having to revisit a bridge with LMWH if she continues to be non-compliant. From her recent INRs and gradual admission to non-compliance during encounters, it is clear that she has a hard time remembering to take medication.   To simplify her regimen from 3 tablets daily to 1 tablet, I have increased her tablet size to 10 mg and will have her commence 1 tablet daily (increased from 8 mg daily). I spent a large amount of time reassuring the patient that I am on her side, medically, and I want nothing more than for her to be therapeutic. Patient is understanding and grateful for care.   Patient verbalized understanding and was provided with written instructions. Next INR check  planned for 02/25/18.   Butch PennyLuke Van Ausdall, PharmD, CPP Clinical Pharmacist [facility] [phone]

## 2018-02-24 NOTE — Telephone Encounter (Signed)
Sent to scheduling to reschedule appt.

## 2018-02-25 ENCOUNTER — Ambulatory Visit: Payer: Self-pay | Admitting: Pharmacist

## 2018-03-24 ENCOUNTER — Ambulatory Visit: Payer: Self-pay | Admitting: Nurse Practitioner

## 2018-05-06 ENCOUNTER — Encounter: Payer: Self-pay | Admitting: *Deleted

## 2018-05-06 ENCOUNTER — Telehealth: Payer: Self-pay

## 2018-05-06 NOTE — Telephone Encounter (Signed)
LMOVM reminding pt to send remote transmission.   

## 2018-05-07 ENCOUNTER — Encounter: Payer: Self-pay | Admitting: Cardiology

## 2018-05-21 ENCOUNTER — Telehealth: Payer: Self-pay

## 2018-05-21 NOTE — Telephone Encounter (Signed)
As per Ranee Gosselin, [employer] [place] they have closed the case with the patient/

## 2018-05-27 ENCOUNTER — Encounter: Payer: Self-pay | Admitting: Internal Medicine

## 2018-05-27 ENCOUNTER — Encounter (HOSPITAL_COMMUNITY): Payer: Self-pay | Admitting: Emergency Medicine

## 2018-05-27 ENCOUNTER — Ambulatory Visit (HOSPITAL_COMMUNITY)
Admission: EM | Admit: 2018-05-27 | Discharge: 2018-05-27 | Disposition: A | Discharge status: Home / Self Care | Payer: Self-pay | Attending: Family Medicine | Admitting: Family Medicine

## 2018-05-27 ENCOUNTER — Other Ambulatory Visit: Payer: Self-pay

## 2018-05-27 ENCOUNTER — Ambulatory Visit (INDEPENDENT_AMBULATORY_CARE_PROVIDER_SITE_OTHER): Payer: Self-pay | Admitting: Internal Medicine

## 2018-05-27 VITALS — BP 120/68 | HR 76 | Ht 63.0 in | Wt 190.0 lb

## 2018-05-27 DIAGNOSIS — G459 Transient cerebral ischemic attack, unspecified: Secondary | ICD-10-CM

## 2018-05-27 DIAGNOSIS — Q246 Congenital heart block: Secondary | ICD-10-CM

## 2018-05-27 DIAGNOSIS — Z86718 Personal history of other venous thrombosis and embolism: Secondary | ICD-10-CM

## 2018-05-27 DIAGNOSIS — L0291 Cutaneous abscess, unspecified: Secondary | ICD-10-CM

## 2018-05-27 DIAGNOSIS — Z950 Presence of cardiac pacemaker: Secondary | ICD-10-CM

## 2018-05-27 LAB — CUP PACEART INCLINIC DEVICE CHECK
Battery Remaining Longevity: 76 mo
Battery Voltage: 3.01 V
Brady Statistic AS VS Percent: 0.01 %
Brady Statistic RV Percent Paced: 99.99 %
Date Time Interrogation Session: 20191001153640
Implantable Lead Implant Date: 20070109
Implantable Lead Location: 753859
Implantable Pulse Generator Implant Date: 20160211
Lead Channel Impedance Value: 361 Ohm
Lead Channel Impedance Value: 418 Ohm
Lead Channel Impedance Value: 475 Ohm
Lead Channel Pacing Threshold Amplitude: 0.625 V
Lead Channel Pacing Threshold Amplitude: 1 V
Lead Channel Sensing Intrinsic Amplitude: 15 mV
Lead Channel Sensing Intrinsic Amplitude: 3.875 mV
Lead Channel Sensing Intrinsic Amplitude: 4.375 mV
Lead Channel Setting Pacing Amplitude: 2 V
Lead Channel Setting Sensing Sensitivity: 0.9 mV
MDC IDC LEAD IMPLANT DT: 20070109
MDC IDC LEAD LOCATION: 753860
MDC IDC MSMT LEADCHNL RA PACING THRESHOLD PULSEWIDTH: 0.4 ms
MDC IDC MSMT LEADCHNL RV IMPEDANCE VALUE: 418 Ohm
MDC IDC MSMT LEADCHNL RV PACING THRESHOLD PULSEWIDTH: 0.4 ms
MDC IDC MSMT LEADCHNL RV SENSING INTR AMPL: 15 mV
MDC IDC SET LEADCHNL RA PACING AMPLITUDE: 1.5 V
MDC IDC SET LEADCHNL RV PACING PULSEWIDTH: 0.4 ms
MDC IDC STAT BRADY AP VP PERCENT: 0.09 %
MDC IDC STAT BRADY AP VS PERCENT: 0 %
MDC IDC STAT BRADY AS VP PERCENT: 99.9 %
MDC IDC STAT BRADY RA PERCENT PACED: 0.09 %

## 2018-05-27 MED ORDER — METOPROLOL SUCCINATE ER 25 MG PO TB24: 25.0000 mg | ORAL_TABLET | Freq: Every day | ORAL | 3 refills | Status: DC | PRN

## 2018-05-27 MED ORDER — CEPHALEXIN 500 MG PO CAPS: 500.0000 mg | ORAL_CAPSULE | Freq: Four times a day (QID) | ORAL | 0 refills | Status: DC

## 2018-05-27 MED ORDER — METOPROLOL SUCCINATE ER 25 MG PO TB24: 25.0000 mg | ORAL_TABLET | Freq: Every day | ORAL | 3 refills | Status: AC | PRN

## 2018-05-27 NOTE — Progress Notes (Signed)
HPI [name] returns today for ongoing evaluation and management of her PPM. 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 In the interim she has done well. She denies chest pain, sob, or syncope. No edema. She has a remote DVT and is on warfarin. She admits to some dietary indiscretion.  No Known Allergies   Current Outpatient Medications  Medication Sig Dispense Refill  . warfarin (COUMADIN) 10 MG tablet Take 1 tablet (10 mg total) by mouth daily. 10 tablet 0  . cetirizine (ZYRTEC) 10 MG tablet Take 1 tablet (10 mg total) by mouth daily. (Patient not taking: Reported on 05/27/2018) 30 tablet 11   No current facility-administered medications for this visit.      Past Medical History:  Diagnosis Date  . Acute deep vein thrombosis (DVT) (HCC) 08/27/2012 and 08/27/2008  . Heart block   . Presence of permanent cardiac pacemaker 1998   FOR CONGENITAL HEART BLOCK  . TIA (transient ischemic attack) 08/27/2013    ROS:   All systems reviewed and negative except as noted in the HPI.   Past Surgical History:  Procedure Laterality Date  . INSERT / REPLACE / REMOVE PACEMAKER  1998   CONGENITAL HEART BLOCK  . PACEMAKER INSERTION       No family history on file.   Social History   Socioeconomic History  . Marital status: Single    Spouse name: Not on file  . Number of children: Not on file  . Years of education: Not on file  . Highest education level: Not on file  Occupational History  . Not on file  Social Needs  . Financial resource strain: Not on file  . Food insecurity:    Worry: Not on file    Inability: Not on file  . Transportation needs:    Medical: Not on file    Non-medical: Not on file  Tobacco Use  . Smoking status: Never Smoker  . Smokeless tobacco: Never Used  Substance and Sexual Activity  . Alcohol use: Yes  . Drug use: No  . Sexual activity: Not on file  Lifestyle  . Physical  activity:    Days per week: Not on file    Minutes per session: Not on file  . Stress: Not on file  Relationships  . Social connections:    Talks on phone: Not on file    Gets together: Not on file    Attends religious service: Not on file    Active member of club or organization: Not on file    Attends meetings of clubs or organizations: Not on file    Relationship status: Not on file  . Intimate partner violence:    Fear of current or ex partner: Not on file    Emotionally abused: Not on file    Physically abused: Not on file    Forced sexual activity: Not on file  Other Topics Concern  . Not on file  Social History Narrative  . Not on file     BP 120/68   Pulse 76   Ht 5\' 3"  (1.6 m)   Wt 190 lb (86.2 kg)   BMI 33.66 kg/m   Physical Exam:  Well appearing young woman, NAD HEENT: Unremarkable Neck:  6 cm JVD, no thyromegally Lymphatics:  No adenopathy Back:  No CVA tenderness Lungs:  Clear with no wheezes HEART:  Regular rate rhythm,  no murmurs, no rubs, no clicks Abd:  soft, positive bowel sounds, no organomegally, no rebound, no guarding Ext:  2 plus pulses, no edema, no cyanosis, no clubbing Skin:  No rashes no nodules Neuro:  CN II through XII intact, motor grossly intact  EKG NSR with ventricular pacing  DEVICE  Normal device function.  See PaceArt for details.   Assess/Plan: 1. CHB - she is doing well s/p PPM insertion. 2. Palpitations - she is mostly well controlled. She will take her beta blocker as needed. 3. DVT - she is asymptomatic and will stay on her warfarin. 4. Weight gain - she has gained over 20 lbs since her last visit. I have asked her to lose 5-10 lbs.  Leonia Reeves.D.

## 2018-05-27 NOTE — ED Triage Notes (Signed)
Pt reports an abscess to her left breast that she first noticed 3-4 days ago.

## 2018-05-27 NOTE — Patient Instructions (Addendum)
Medication Instructions:  Your physician has recommended you make the following change in your medication:  1.  Take Toprol XL 25 mg one tablet by mouth daily as needed.  Labwork: None ordered.  Testing/Procedures: None ordered.  Follow-Up: Your physician wants you to follow-up in: one year with Dr. Ladona Ridgel.   You will receive a reminder letter in the mail two months in advance. If you don't receive a letter, please call our office to schedule the follow-up appointment.  Remote monitoring is used to monitor your Pacemaker from home. This monitoring reduces the number of office visits required to check your device to one time per year. It allows [place] to keep an eye on the functioning of your device to ensure it is working properly. You are scheduled for a device check from home on 08/26/2018. You may send your transmission at any time that day. If you have a wireless device, the transmission will be sent automatically. After your physician reviews your transmission, you will receive a postcard with your next transmission date.  Any Other Special Instructions Will Be Listed Below (If Applicable).  If you need a refill on your cardiac medications before your next appointment, please call your pharmacy.

## 2018-05-27 NOTE — ED Provider Notes (Signed)
[facility]    CSN: [id number] Arrival date & time: 05/27/18  1158     History   Chief Complaint Chief Complaint  Patient presents with  . Abscess    HPI Elizabeth Ballard is a 22 y.o. female.    Abscess  Location:  Torso Torso abscess location:  L breast Size:  2cm x 2 cm Abscess quality: draining, fluctuance, induration, painful and redness   Abscess quality: no itching, no warmth and not weeping   Red streaking: no   Duration:  4 days Progression:  Worsening Pain details:    Quality:  Dull and pressure   Severity:  Mild   Duration:  4 days   Timing:  Constant   Progression:  Waxing and waning Chronicity:  New Context: not diabetes, not immunosuppression, not injected drug use, not insect bite/sting and not skin injury   Worsened by:  Draining/squeezing Ineffective treatments:  Warm compresses Associated symptoms: no anorexia, no fatigue, no fever, no headaches, no nausea and no vomiting   Risk factors: no hx of MRSA and no prior abscess     Past Medical History:  Diagnosis Date  . Acute deep vein thrombosis (DVT) (HCC) 08/27/2012 and 08/27/2008  . Heart block   . Presence of permanent cardiac pacemaker 1998   FOR CONGENITAL HEART BLOCK  . TIA (transient ischemic attack) 08/27/2013    Patient Active Problem List   Diagnosis Date Noted  . Encounter for monitoring Coumadin therapy 08/07/2016  . Syncope and collapse   . Nonintractable headache   . Syncope 08/01/2016  . History of DVT (deep vein thrombosis) 08/01/2016  . Congenital heart block 08/01/2016  . Pacemaker 08/01/2016  . Chronic headaches 08/01/2016    Past Surgical History:  Procedure Laterality Date  . INSERT / REPLACE / REMOVE PACEMAKER  1998   CONGENITAL HEART BLOCK  . PACEMAKER INSERTION      OB History   None      Home Medications    Prior to Admission medications   Medication Sig Start Date End Date Taking? Authorizing Provider  metoprolol succinate (TOPROL XL) 25  MG 24 hr tablet Take 1 tablet (25 mg total) by mouth daily as needed. 05/27/18  Yes Marinus Maw, MD  warfarin (COUMADIN) 10 MG tablet Take 1 tablet (10 mg total) by mouth daily. 02/21/18  Yes Hoy Register, MD  cephALEXin (KEFLEX) 500 MG capsule Take 1 capsule (500 mg total) by mouth 4 (four) times daily. 05/27/18   Dahlia Byes A, NP  cetirizine (ZYRTEC) 10 MG tablet Take 1 tablet (10 mg total) by mouth daily. Patient not taking: Reported on 05/27/2018 01/17/17   Dessa Phi, MD    Family History History reviewed. No pertinent family history.  Social History Social History   Tobacco Use  . Smoking status: Never Smoker  . Smokeless tobacco: Never Used  Substance Use Topics  . Alcohol use: Yes  . Drug use: No     Allergies   Patient has no known allergies.   Review of Systems Review of Systems  Constitutional: Negative for fatigue and fever.  Gastrointestinal: Negative for anorexia, nausea and vomiting.  Neurological: Negative for headaches.     Physical Exam Triage Vital Signs ED Triage Vitals  Enc Vitals Group     BP 05/27/18 1225 130/87     Pulse Rate 05/27/18 1225 85     Resp 05/27/18 1225 16     Temp 05/27/18 1225 98.2 F (36.8 C)  Temp Source 05/27/18 1225 Oral     SpO2 05/27/18 1225 100 %     Weight --      Height --      Head Circumference --      Peak Flow --      Pain Score 05/27/18 1231 10     Pain Loc --      Pain Edu? --      Excl. in GC? --    No data found.  Updated Vital Signs BP 130/87 (BP Location: Left Arm)   Pulse 85   Temp 98.2 F (36.8 C) (Oral)   Resp 16   LMP 05/19/2018   SpO2 100%   Visual Acuity Right Eye Distance:   Left Eye Distance:   Bilateral Distance:    Right Eye Near:   Left Eye Near:    Bilateral Near:     Physical Exam  Constitutional: She is oriented to person, place, and time. She appears well-developed and well-nourished.  Very pleasant. Non toxic or ill appearing.     HENT:  Head:  Normocephalic and atraumatic.  Eyes: Conjunctivae are normal.  Neck: Normal range of motion.  Pulmonary/Chest: Effort normal.    Musculoskeletal: Normal range of motion.  Neurological: She is alert and oriented to person, place, and time.  Skin: Skin is warm and dry.  2 cm x 2 cm abscess to left medial breast adjacent to chest wall.  Minimal drainage noted.  Fluctuance with surrounding erythema and induration  Psychiatric: She has a normal mood and affect. Her behavior is normal.  Nursing note and vitals reviewed.    UC Treatments / Results  Labs (all labs ordered are listed, but only abnormal results are displayed) Labs Reviewed - No data to display  EKG None  Radiology No results found.  Procedures Incision and Drainage Date/Time: 05/27/2018 5:05 PM Performed by: Janace Aris, NP Authorized by: Janace Aris, NP   Consent:    Consent obtained:  Verbal   Consent given by:  Patient   Risks discussed:  Bleeding, incomplete drainage and pain Location:    Type:  Abscess   Location:  Trunk   Trunk location:  L breast Pre-procedure details:    Skin preparation:  Betadine Anesthesia (see MAR for exact dosages):    Anesthesia method:  None Procedure type:    Complexity:  Simple Procedure details:    Needle aspiration: no     Incision types:  Stab incision   Incision depth:  Subcutaneous   Scalpel blade:  11   Drainage:  Purulent   Drainage amount:  Moderate   Wound treatment:  Wound left open   Packing materials:  None Post-procedure details:    Patient tolerance of procedure:  Tolerated well, no immediate complications   (including critical care time)  Medications Ordered in UC Medications - No data to display  Initial Impression / Assessment and Plan / UC Course  I have reviewed the triage vital signs and the nursing notes.  Pertinent labs & imaging results that were available during my care of the patient were reviewed by me and considered in my medical  decision making (see chart for details).     Abscess to left/breast. Opened up to promote more drainage Significant amount of purulent drainage expressed Dressed area Place patient on antibiotic coverage Instructed to do warm compresses to promote more drainage Follow up as needed for continued or worsening symptoms  Final Clinical Impressions(s) / UC Diagnoses  Final diagnoses:  Abscess     Discharge Instructions     We opened up the abscess to promote more drainage Continue the warm compresses to 3 times a day Keflex for antibiotic coverage Follow up as needed for continued or worsening symptoms     ED Prescriptions    Medication Sig Dispense Auth. Provider   cephALEXin (KEFLEX) 500 MG capsule Take 1 capsule (500 mg total) by mouth 4 (four) times daily. 28 capsule Dahlia Byes A, NP     Controlled Substance Prescriptions [place] Controlled Substance Registry consulted? Not Applicable   Janace Aris, NP 05/27/18 1705

## 2018-05-27 NOTE — Discharge Instructions (Signed)
We opened up the abscess to promote more drainage Continue the warm compresses to 3 times a day Keflex for antibiotic coverage Follow up as needed for continued or worsening symptoms

## 2018-06-24 NOTE — Progress Notes (Deleted)
Patient ID: Elizabeth Ballard, female   DOB: July 21, 1996, 22 y.o.   MRN: [id number]  After being seen at [facility] 05/27/2018 for an abscess of the L breast.  I&D was performed and Keflex was prescribed.

## 2018-06-25 ENCOUNTER — Inpatient Hospital Stay: Payer: Self-pay

## 2018-07-04 ENCOUNTER — Inpatient Hospital Stay: Payer: Self-pay | Admitting: Internal Medicine

## 2018-07-04 ENCOUNTER — Inpatient Hospital Stay: Payer: Self-pay

## 2018-07-10 ENCOUNTER — Ambulatory Visit: Payer: Self-pay | Attending: Family Medicine | Admitting: Physician Assistant

## 2018-07-10 ENCOUNTER — Other Ambulatory Visit: Payer: Self-pay

## 2018-07-10 VITALS — BP 123/86 | HR 91 | Temp 98.6°F | Resp 16 | Ht 63.0 in | Wt 182.6 lb

## 2018-07-10 DIAGNOSIS — Z79899 Other long term (current) drug therapy: Secondary | ICD-10-CM | POA: Insufficient documentation

## 2018-07-10 DIAGNOSIS — Z7901 Long term (current) use of anticoagulants: Secondary | ICD-10-CM | POA: Insufficient documentation

## 2018-07-10 DIAGNOSIS — Z8673 Personal history of transient ischemic attack (TIA), and cerebral infarction without residual deficits: Secondary | ICD-10-CM

## 2018-07-10 DIAGNOSIS — Z950 Presence of cardiac pacemaker: Secondary | ICD-10-CM | POA: Insufficient documentation

## 2018-07-10 DIAGNOSIS — Z86718 Personal history of other venous thrombosis and embolism: Secondary | ICD-10-CM

## 2018-07-10 DIAGNOSIS — I442 Atrioventricular block, complete: Secondary | ICD-10-CM | POA: Insufficient documentation

## 2018-07-10 DIAGNOSIS — N921 Excessive and frequent menstruation with irregular cycle: Secondary | ICD-10-CM

## 2018-07-10 DIAGNOSIS — Z9119 Patient's noncompliance with other medical treatment and regimen: Secondary | ICD-10-CM | POA: Insufficient documentation

## 2018-07-10 DIAGNOSIS — R300 Dysuria: Secondary | ICD-10-CM

## 2018-07-10 LAB — POCT URINALYSIS DIP (CLINITEK)
GLUCOSE UA: NEGATIVE mg/dL
NITRITE UA: NEGATIVE
PH UA: 7.5 (ref 5.0–8.0)
POC PROTEIN,UA: 30 — AB
Spec Grav, UA: 1.02 (ref 1.010–1.025)
UROBILINOGEN UA: 4 U/dL — AB

## 2018-07-10 MED ORDER — METRONIDAZOLE 0.75 % VA GEL: 1.0000 | Freq: Every day | VAGINAL | 0 refills | Status: AC

## 2018-07-10 MED ORDER — WARFARIN SODIUM 5 MG PO TABS: 5.0000 mg | ORAL_TABLET | Freq: Every day | ORAL | 3 refills | Status: DC

## 2018-07-10 MED ORDER — METRONIDAZOLE 0.75 % VA GEL: 1.0000 | Freq: Every day | VAGINAL | 0 refills | Status: DC

## 2018-07-10 MED ORDER — WARFARIN SODIUM 3 MG PO TABS: 3.0000 mg | ORAL_TABLET | Freq: Once | ORAL | 3 refills | Status: DC

## 2018-07-10 MED FILL — metroNIDAZOLE 0.75 % GEL: 0.75 | 5 days supply | Qty: 70 | Fill #0

## 2018-07-10 NOTE — Patient Instructions (Addendum)
I do not see any infection in your urine I will treat empirically for BV This may all be from the extensive bleeding vaginally I have referred you to hematology and GYN U really NEED to take the Coumadin until we get clarity from hematology. 8 mg EVERY evening please!

## 2018-07-10 NOTE — Progress Notes (Signed)
HFU-  She has a h/o BV and UTI's and feels like this is the case today, dysuria  INR check

## 2018-07-10 NOTE — Progress Notes (Signed)
Chief Complaint: "issue with my urine, check blood level"  Subjective: This is a 22 year old female with history of congenital heart block status post permanent pacemaker insertion.  She had her generator change approximate 4 years ago and is followed by Dr. Ladona Ridgel.  Her last device check was last month and her device is appropriately functioning.  She chronically takes Coumadin for history of DVTs and TIAs in the past.  Apparently her hematology work-up was negative yet she is still on chronic Coumadin therapy.  She is noncompliant but on Coumadin supposedly.  The last records that we have from pediatric heme oncology suggest that she chronically take Coumadin.  We have not checked a level since June and at that time she was subtherapeutic.  It looks like she has not had her prescription for Coumadin fill since April 2019. She has a history of syncope as well.  Today she has 2 separate complaints.  First she states that she is having some difficulty urinating.  She feels like she is going frequently.  She feels like there may be some odor to her urine as well.  She also states that she has had a menstrual cycle for the last 6 weeks straight.  She states that sometimes is heavy and then sometimes it looks like it is going to stop but then starts back up again.  She previously was on Nexplanon and birth control pills prior.  She has not had any type of contraceptive in 5 to 6 years.  She has not been sexually active for 1 year.   ROS:  GEN: denies fever or chills, denies change in weight Skin: denies lesions or rashes HEENT: denies headache, earache, epistaxis, sore throat, or neck pain LUNGS: denies SHOB, dyspnea, PND, orthopnea CV: denies CP or palpitations ABD: denies abd pain, N or V EXT: denies muscle spasms or swelling; no pain in lower ext, no weakness NEURO: denies numbness or tingling, denies sz, stroke or TIA   Objective:  Vitals:   07/10/18 1150  BP: 123/86  Pulse: 91  Resp: 16   Temp: 98.6 F (37 C)  TempSrc: Oral  SpO2: 99%  Weight: 182 lb 9.6 oz (82.8 kg)  Height: 5\' 3"  (1.6 m)    Physical Exam:  General: in no acute distress. Heart: Normal  s1 &s2  Regular rate and rhythm, without murmurs, rubs, gallops. Abdomen: Soft, nontender, nondistended, positive bowel sounds. Extremities: No clubbing cyanosis or edema with positive pedal pulses. GU: deferred, pt on menses Neuro: Alert, awake, oriented x3, nonfocal.  Pertinent Lab Results:dipstick with blood and only trace leuks; INR 1.1   Medications: Prior to Admission medications   Medication Sig Start Date End Date Taking? Authorizing Provider  cetirizine (ZYRTEC) 10 MG tablet Take 1 tablet (10 mg total) by mouth daily. 01/17/17  Yes Funches, Josalyn, MD  metoprolol succinate (TOPROL XL) 25 MG 24 hr tablet Take 1 tablet (25 mg total) by mouth daily as needed. 05/27/18  Yes Marinus Maw, MD  cephALEXin (KEFLEX) 500 MG capsule Take 1 capsule (500 mg total) by mouth 4 (four) times daily. Patient not taking: Reported on 07/10/2018 05/27/18   Dahlia Byes A, NP  metroNIDAZOLE (METROGEL VAGINAL) 0.75 % vaginal gel Place 1 Applicatorful vaginally at bedtime for 5 days. 07/10/18 07/15/18  Vivianne Master, PA-C  warfarin (COUMADIN) 3 MG tablet Take 1 tablet (3 mg total) by mouth one time only at 6 PM for 1 dose. 07/10/18 07/11/18  Vivianne Master, PA-C  warfarin (  COUMADIN) 5 MG tablet Take 1 tablet (5 mg total) by mouth daily. 07/10/18   Vivianne MasterNoel, Alfred Eckley S, PA-C    Assessment: 1. Dysuria/vaginal odor 2. Chronic Coumadin Use  -hx DVT and TIAs 3. PPM in situ for congential CHB  -gen change 4 yrs ago  -followed by Dr. Ladona Ridgelaylor, recent check satisfactory 4. Metrorrhagia  Plan: Metrogel INR 1.1, inc back to 8 mg (last known working dose) Encouraged compliance Seen by Ilda BassetPharm D and will see again in 1 week Honestly wondering if coumadin can be dc'd all together, workup negative many years ago for a hypercoaguable state.  Will ask hematology to weigh in. If not DC Coumadin, ? Change to NOAC in hopes of better compliance.  CARDS  as above Referral to GYN  Follow up:as above  The patient was given clear instructions to go to ER or return to medical center if symptoms don't improve, worsen or new problems develop. The patient verbalized understanding. The patient was told to call to get lab results if they haven't heard anything in the next week.   This note has been created with Education officer, environmentalDragon speech recognition software and smart phrase technology. Any transcriptional errors are unintentional.   Scot Juniffany Theus Espin, PA-C 07/10/2018, 12:34 PM

## 2018-07-11 ENCOUNTER — Encounter: Payer: Self-pay | Admitting: Hematology and Oncology

## 2018-07-11 ENCOUNTER — Telehealth: Payer: Self-pay | Admitting: Hematology and Oncology

## 2018-07-11 NOTE — Telephone Encounter (Signed)
New referral received from [name], [place] for hx of DVT. Pt has been cld and scheduled to see Dr. Caron Presumeuben on 12/4 at 9am. Pt aware to arrive 15 minutes early. Letter mailed.

## 2018-07-16 ENCOUNTER — Emergency Department (HOSPITAL_COMMUNITY): Payer: Self-pay

## 2018-07-16 ENCOUNTER — Emergency Department (HOSPITAL_COMMUNITY)
Admission: EM | Admit: 2018-07-16 | Discharge: 2018-07-16 | Disposition: A | Discharge status: Home / Self Care | Payer: Self-pay | Attending: Emergency Medicine | Admitting: Emergency Medicine

## 2018-07-16 ENCOUNTER — Other Ambulatory Visit: Payer: Self-pay

## 2018-07-16 ENCOUNTER — Encounter (HOSPITAL_COMMUNITY): Payer: Self-pay

## 2018-07-16 DIAGNOSIS — R102 Pelvic and perineal pain: Secondary | ICD-10-CM | POA: Insufficient documentation

## 2018-07-16 DIAGNOSIS — Z950 Presence of cardiac pacemaker: Secondary | ICD-10-CM | POA: Insufficient documentation

## 2018-07-16 DIAGNOSIS — Z79899 Other long term (current) drug therapy: Secondary | ICD-10-CM | POA: Insufficient documentation

## 2018-07-16 DIAGNOSIS — R1030 Lower abdominal pain, unspecified: Secondary | ICD-10-CM

## 2018-07-16 DIAGNOSIS — Z8673 Personal history of transient ischemic attack (TIA), and cerebral infarction without residual deficits: Secondary | ICD-10-CM | POA: Insufficient documentation

## 2018-07-16 DIAGNOSIS — N72 Inflammatory disease of cervix uteri: Secondary | ICD-10-CM | POA: Insufficient documentation

## 2018-07-16 DIAGNOSIS — N898 Other specified noninflammatory disorders of vagina: Secondary | ICD-10-CM | POA: Insufficient documentation

## 2018-07-16 DIAGNOSIS — Z7901 Long term (current) use of anticoagulants: Secondary | ICD-10-CM | POA: Insufficient documentation

## 2018-07-16 LAB — COMPREHENSIVE METABOLIC PANEL
ALT: 15 U/L (ref 0–44)
ANION GAP: 5 (ref 5–15)
AST: 26 U/L (ref 15–41)
Albumin: 4.3 g/dL (ref 3.5–5.0)
Alkaline Phosphatase: 52 U/L (ref 38–126)
BILIRUBIN TOTAL: 0.5 mg/dL (ref 0.3–1.2)
BUN: 8 mg/dL (ref 6–20)
CHLORIDE: 107 mmol/L (ref 98–111)
CO2: 25 mmol/L (ref 22–32)
Calcium: 9.2 mg/dL (ref 8.9–10.3)
Creatinine, Ser: 0.93 mg/dL (ref 0.44–1.00)
Glucose, Bld: 99 mg/dL (ref 70–99)
POTASSIUM: 4.6 mmol/L (ref 3.5–5.1)
Sodium: 137 mmol/L (ref 135–145)
TOTAL PROTEIN: 7.7 g/dL (ref 6.5–8.1)

## 2018-07-16 LAB — URINALYSIS, ROUTINE W REFLEX MICROSCOPIC
BILIRUBIN URINE: NEGATIVE
Bacteria, UA: NONE SEEN
GLUCOSE, UA: NEGATIVE mg/dL
Ketones, ur: NEGATIVE mg/dL
Nitrite: NEGATIVE
PH: 6 (ref 5.0–8.0)
Protein, ur: NEGATIVE mg/dL
SPECIFIC GRAVITY, URINE: 1.019 (ref 1.005–1.030)

## 2018-07-16 LAB — POC URINE PREG, ED: Preg Test, Ur: NEGATIVE

## 2018-07-16 LAB — LIPASE, BLOOD: LIPASE: 24 U/L (ref 11–51)

## 2018-07-16 LAB — CBC WITH DIFFERENTIAL/PLATELET
Abs Immature Granulocytes: 0.01 10*3/uL (ref 0.00–0.07)
BASOS ABS: 0 10*3/uL (ref 0.0–0.1)
Basophils Relative: 1 %
Eosinophils Absolute: 0.2 10*3/uL (ref 0.0–0.5)
Eosinophils Relative: 4 %
HEMATOCRIT: 42 % (ref 36.0–46.0)
Hemoglobin: 14.1 g/dL (ref 12.0–15.0)
Immature Granulocytes: 0 %
LYMPHS PCT: 42 %
Lymphs Abs: 1.8 10*3/uL (ref 0.7–4.0)
MCH: 31 pg (ref 26.0–34.0)
MCHC: 33.6 g/dL (ref 30.0–36.0)
MCV: 92.3 fL (ref 80.0–100.0)
Monocytes Absolute: 0.3 10*3/uL (ref 0.1–1.0)
Monocytes Relative: 6 %
NRBC: 0 % (ref 0.0–0.2)
Neutro Abs: 2 10*3/uL (ref 1.7–7.7)
Neutrophils Relative %: 47 %
PLATELETS: 164 10*3/uL (ref 150–400)
RBC: 4.55 MIL/uL (ref 3.87–5.11)
RDW: 11.4 % — AB (ref 11.5–15.5)
WBC: 4.2 10*3/uL (ref 4.0–10.5)

## 2018-07-16 LAB — WET PREP, GENITAL
Clue Cells Wet Prep HPF POC: NONE SEEN
Sperm: NONE SEEN
TRICH WET PREP: NONE SEEN
WBC WET PREP: NONE SEEN
YEAST WET PREP: NONE SEEN

## 2018-07-16 MED ORDER — CEFTRIAXONE SODIUM 250 MG IJ SOLR
250.0000 mg | Freq: Once | INTRAMUSCULAR | Status: AC
  Administered 2018-07-16: 250 mg via INTRAMUSCULAR
  Filled 2018-07-16: qty 250

## 2018-07-16 MED ORDER — LIDOCAINE HCL (PF) 1 % IJ SOLN
INTRAMUSCULAR | Status: AC
  Administered 2018-07-16: 2.1 mL
  Filled 2018-07-16: qty 5

## 2018-07-16 MED ORDER — AZITHROMYCIN 1 G PO PACK
1.0000 g | PACK | Freq: Once | ORAL | Status: AC
  Administered 2018-07-16: 1 g via ORAL
  Filled 2018-07-16: qty 1

## 2018-07-16 NOTE — Discharge Instructions (Addendum)
Your exam today with tenderness on pelvic exam and the discharge we discovered is consistent with cervicitis (inflammation and infection on the cervix).  The ultrasound was normal.  We treated you with antibiotics.  Your work-up otherwise was reassuring and some of the lab tests have not been completed yet.  You will be called if those results are positive.  Please follow-up with a primary care physician and your OB/GYN for further management.  If any symptoms change or worsen, please return to the nearest emergency department.

## 2018-07-16 NOTE — ED Notes (Signed)
Pt ambulated to RR without any assistance.

## 2018-07-16 NOTE — ED Notes (Signed)
ED Provider at bedside. 

## 2018-07-16 NOTE — ED Triage Notes (Signed)
Pt reports since last Friday, constant lower abdominal pain, irregular bleeding, vaginal discharge, and vaginal swelling. Pt reports some nausea, denies vomiting or diarrhea.

## 2018-07-16 NOTE — ED Notes (Signed)
Patient verbalizes understanding of discharge instructions. Opportunity for questioning and answers were provided. Armband removed by staff, pt discharged from ED. Pt ambulatory to lobby.  

## 2018-07-16 NOTE — ED Notes (Signed)
Pt informed she needs to provide urine when possible; Pt given UA cup and verbalized understanding.

## 2018-07-16 NOTE — ED Provider Notes (Signed)
[facility] Provider Note   CSN: [id number] Arrival date & time: 07/16/18  1052     History   Chief Complaint No chief complaint on file.   HPI Elizabeth Ballard is a 22 y.o. female.  The history is provided by the patient and medical records. No language interpreter was used.  Abdominal Pain   This is a new problem. The current episode started more than 2 days ago. The problem occurs constantly. The problem has not changed since onset.The pain is associated with an unknown factor. The pain is located in the perineum. The quality of the pain is sharp and aching. The pain is moderate. Pertinent negatives include fever, nausea, vomiting, constipation, dysuria, frequency and headaches. Nothing aggravates the symptoms. The symptoms are relieved by palpation.    Past Medical History:  Diagnosis Date  . Acute deep vein thrombosis (DVT) (HCC) 08/27/2012 and 08/27/2008  . Heart block   . Presence of permanent cardiac pacemaker 1998   FOR CONGENITAL HEART BLOCK  . TIA (transient ischemic attack) 08/27/2013    Patient Active Problem List   Diagnosis Date Noted  . Encounter for monitoring Coumadin therapy 08/07/2016  . Syncope and collapse   . Nonintractable headache   . Syncope 08/01/2016  . History of DVT (deep vein thrombosis) 08/01/2016  . Congenital heart block 08/01/2016  . Pacemaker 08/01/2016  . Chronic headaches 08/01/2016    Past Surgical History:  Procedure Laterality Date  . INSERT / REPLACE / REMOVE PACEMAKER  1998   CONGENITAL HEART BLOCK  . PACEMAKER INSERTION       OB History   None      Home Medications    Prior to Admission medications   Medication Sig Start Date End Date Taking? Authorizing Provider  cephALEXin (KEFLEX) 500 MG capsule Take 1 capsule (500 mg total) by mouth 4 (four) times daily. Patient not taking: Reported on 07/10/2018 05/27/18   Dahlia ByesBast, Traci A, NP  cetirizine (ZYRTEC) 10 MG tablet Take 1 tablet  (10 mg total) by mouth daily. 01/17/17   Funches, Gerilyn NestleJosalyn, MD  metoprolol succinate (TOPROL XL) 25 MG 24 hr tablet Take 1 tablet (25 mg total) by mouth daily as needed. 05/27/18   Marinus Mawaylor, Gregg W, MD  warfarin (COUMADIN) 3 MG tablet Take 1 tablet (3 mg total) by mouth one time only at 6 PM for 1 dose. 07/10/18 07/11/18  Vivianne MasterNoel, Tiffany S, PA-C  warfarin (COUMADIN) 5 MG tablet Take 1 tablet (5 mg total) by mouth daily. 07/10/18   Vivianne MasterNoel, Tiffany S, PA-C    Family History No family history on file.  Social History Social History   Tobacco Use  . Smoking status: Never Smoker  . Smokeless tobacco: Never Used  Substance Use Topics  . Alcohol use: Yes  . Drug use: No     Allergies   Patient has no known allergies.   Review of Systems Review of Systems  Constitutional: Negative for chills, diaphoresis, fatigue and fever.  HENT: Negative for congestion.   Eyes: Negative for visual disturbance.  Respiratory: Negative for cough, chest tightness, shortness of breath, wheezing and stridor.   Cardiovascular: Negative for chest pain and palpitations.  Gastrointestinal: Positive for abdominal pain. Negative for abdominal distention, constipation, nausea and vomiting.  Genitourinary: Positive for pelvic pain, vaginal bleeding, vaginal discharge and vaginal pain. Negative for dysuria, flank pain and frequency.  Musculoskeletal: Negative for back pain, neck pain and neck stiffness.  Neurological: Negative for light-headedness and headaches.  Psychiatric/Behavioral: Negative for agitation and confusion.  All other systems reviewed and are negative.    Physical Exam Updated Vital Signs BP (!) 131/98 (BP Location: Right Arm)   Pulse 86   Temp 98.3 F (36.8 C) (Oral)   Resp 18   Ht 5\' 3"  (1.6 m)   Wt 82.6 kg   SpO2 100%   BMI 32.24 kg/m   Physical Exam  Constitutional: She is oriented to person, place, and time. She appears well-developed and well-nourished.  Non-toxic appearance. She does  not appear ill. No distress.  HENT:  Head: Normocephalic.  Mouth/Throat: Oropharynx is clear and moist. No oropharyngeal exudate.  Eyes: Pupils are equal, round, and reactive to light. Conjunctivae and EOM are normal.  Neck: Normal range of motion.  Cardiovascular: Normal rate and intact distal pulses.  No murmur heard. Pulmonary/Chest: Effort normal. No stridor. No respiratory distress. She has no wheezes. She exhibits no tenderness.  Abdominal: Soft. She exhibits no distension. There is tenderness. There is no rebound.  Genitourinary: Pelvic exam was performed with patient supine. There is no rash, tenderness or lesion on the right labia. There is no rash, tenderness or lesion on the left labia. Uterus is tender. Cervix exhibits motion tenderness and discharge. Right adnexum displays tenderness. Left adnexum displays tenderness. There is tenderness in the vagina. No erythema in the vagina. Vaginal discharge found.  Musculoskeletal: She exhibits no edema, tenderness or deformity.  Neurological: She is alert and oriented to person, place, and time.  Skin: Capillary refill takes less than 2 seconds. No rash noted. She is not diaphoretic. No erythema.  Psychiatric: She has a normal mood and affect.  Nursing note and vitals reviewed.    ED Treatments / Results  Labs (all labs ordered are listed, but only abnormal results are displayed) Labs Reviewed  CBC WITH DIFFERENTIAL/PLATELET - Abnormal; Notable for the following components:      Result Value   RDW 11.4 (*)    All other components within normal limits  URINALYSIS, ROUTINE W REFLEX MICROSCOPIC - Abnormal; Notable for the following components:   Hgb urine dipstick SMALL (*)    Leukocytes, UA TRACE (*)    All other components within normal limits  WET PREP, GENITAL  URINE CULTURE  COMPREHENSIVE METABOLIC PANEL  LIPASE, BLOOD  POC URINE PREG, ED  GC/CHLAMYDIA PROBE AMP ([place]) NOT AT [facility]  WET PREP  (BD AFFIRM) ([place])      EKG None  Radiology US Transvaginal Non-ob  Result Date: 07/16/2018 CLINICAL DATA:  Abdominal plain, vaginal discharge and bleeding. EXAM: TRANSABDOMINAL AND TRANSVAGINAL ULTRASOUND OF PELVIS DOPPLER ULTRASOUND OF OVARIES TECHNIQUE: Both transabdominal and transvaginal ultrasound examinations of the pelvis were performed. Transabdominal technique was performed for global imaging of the pelvis including uterus, ovaries, adnexal regions, and pelvic cul-de-sac. It was necessary to proceed with endovaginal exam following the transabdominal exam to visualize the endometrium and adnexa. Color and duplex Doppler ultrasound was utilized to evaluate blood flow to the ovaries. COMPARISON:  None. FINDINGS: Uterus Measurements: 6.4 x 3.3 x 4 cm = volume: 43.3 mL. No fibroids or other mass visualized. Endometrium Thickness: 7 mm.  No focal abnormality visualized. Right ovary Measurements: 3.5 x 1.9 x 2.7 cm = volume: 9.3 mL. Normal appearance/no adnexal mass. Left ovary Measurements: 3 x 1.9 x 2.7 cm = volume: 7.9 mL. Normal appearance/no adnexal mass. Pulsed Doppler evaluation of both ovaries demonstrates normal low-resistance arterial and venous waveforms. Other findings Small volume free fluid, likely physiologic.  IMPRESSION: Normal pelvic ultrasound. Electronically Signed   By: Awilda Metro M.D.   On: 07/16/2018 14:59   US Pelvis Complete  Result Date: 07/16/2018 CLINICAL DATA:  Abdominal plain, vaginal discharge and bleeding. EXAM: TRANSABDOMINAL AND TRANSVAGINAL ULTRASOUND OF PELVIS DOPPLER ULTRASOUND OF OVARIES TECHNIQUE: Both transabdominal and transvaginal ultrasound examinations of the pelvis were performed. Transabdominal technique was performed for global imaging of the pelvis including uterus, ovaries, adnexal regions, and pelvic cul-de-sac. It was necessary to proceed with endovaginal exam following the transabdominal exam to visualize the endometrium and adnexa. Color and duplex Doppler  ultrasound was utilized to evaluate blood flow to the ovaries. COMPARISON:  None. FINDINGS: Uterus Measurements: 6.4 x 3.3 x 4 cm = volume: 43.3 mL. No fibroids or other mass visualized. Endometrium Thickness: 7 mm.  No focal abnormality visualized. Right ovary Measurements: 3.5 x 1.9 x 2.7 cm = volume: 9.3 mL. Normal appearance/no adnexal mass. Left ovary Measurements: 3 x 1.9 x 2.7 cm = volume: 7.9 mL. Normal appearance/no adnexal mass. Pulsed Doppler evaluation of both ovaries demonstrates normal low-resistance arterial and venous waveforms. Other findings Small volume free fluid, likely physiologic. IMPRESSION: Normal pelvic ultrasound. Electronically Signed   By: Awilda Metro M.D.   On: 07/16/2018 14:59   [place] Art/ven Flow Abd Pelv Doppler  Result Date: 07/16/2018 CLINICAL DATA:  Abdominal plain, vaginal discharge and bleeding. EXAM: TRANSABDOMINAL AND TRANSVAGINAL ULTRASOUND OF PELVIS DOPPLER ULTRASOUND OF OVARIES TECHNIQUE: Both transabdominal and transvaginal ultrasound examinations of the pelvis were performed. Transabdominal technique was performed for global imaging of the pelvis including uterus, ovaries, adnexal regions, and pelvic cul-de-sac. It was necessary to proceed with endovaginal exam following the transabdominal exam to visualize the endometrium and adnexa. Color and duplex Doppler ultrasound was utilized to evaluate blood flow to the ovaries. COMPARISON:  None. FINDINGS: Uterus Measurements: 6.4 x 3.3 x 4 cm = volume: 43.3 mL. No fibroids or other mass visualized. Endometrium Thickness: 7 mm.  No focal abnormality visualized. Right ovary Measurements: 3.5 x 1.9 x 2.7 cm = volume: 9.3 mL. Normal appearance/no adnexal mass. Left ovary Measurements: 3 x 1.9 x 2.7 cm = volume: 7.9 mL. Normal appearance/no adnexal mass. Pulsed Doppler evaluation of both ovaries demonstrates normal low-resistance arterial and venous waveforms. Other findings Small volume free fluid, likely physiologic.  IMPRESSION: Normal pelvic ultrasound. Electronically Signed   By: Awilda Metro M.D.   On: 07/16/2018 14:59    Procedures Procedures (including critical care time)  Medications Ordered in ED Medications  cefTRIAXone (ROCEPHIN) injection 250 mg (250 mg Intramuscular Given 07/16/18 1622)  azithromycin (ZITHROMAX) powder 1 g (1 g Oral Given 07/16/18 1622)  lidocaine (PF) (XYLOCAINE) 1 % injection (2.1 mLs  Given 07/16/18 1622)     Initial Impression / Assessment and Plan / ED Course  I have reviewed the triage vital signs and the nursing notes.  Pertinent labs & imaging results that were available during my care of the patient were reviewed by me and considered in my medical decision making (see chart for details).     Elizabeth Ballard is a 22 y.o. female with a past medical history significant for complete heart block status post pacemaker, prior DVT on Coumadin therapy, TIA, and chronic headaches who presents with abdominal pain, pelvic pain, and vaginal bleeding.  Patient reports that for the last month she has had vaginal bleeding that has been improving.  She reports that over the last few months have been changing her birth control medications around.  She says  that she has had pain over the last 5 days develop in her lower abdomen and her vagina and pelvic area.  She reports that she thought there was some swelling down there.  She denies significant vaginal discharge but reports the bleeding is persistent.  She denies nausea or vomiting.  No fevers, chills, chest pain, shortness breath, palpitations, lightheadedness, or syncope.  No recent trauma.  She denies any history of STI.    On exam, abdomen is slightly tender and superpubic area.  No flank tenderness or CVA tenderness.  Lungs clear.  Chest is nontender.    Patient with a pelvic exam.  Patient will screen laboratory testing and urinalysis.  I anticipate pelvic ultrasound as well.    Anticipate reassessment after pelvic  and work-up.   Pelvic exam revealed tenderness in both adnexa and her cervix.  Discharge was present.  Minimal bleeding.  Patient will have ultrasound to look for TOA or other abnormality.  Anticipate empiric antibiotic treatment due to the discharge and tenderness.  Pelvic ultrasound was reassuring.  Wet prep was negative however due to the significant discharge and tenderness on her cervix, patient be treated for cervicitis.  Patient given Rocephin and azithromycin and will follow-up with OB/GYN and PCP.  Work was otherwise reassuring.  Patient was felt stable for discharge home.  Patient discharged in good condition with resolution of abdominal discomfort.    Final Clinical Impressions(s) / ED Diagnoses   Final diagnoses:  Lower abdominal pain  Pelvic pain  Vaginal discharge  Cervicitis    ED Discharge Orders    None     Clinical Impression: 1. Lower abdominal pain   2. Pelvic pain   3. Vaginal discharge   4. Cervicitis     Disposition: Discharge  Condition: Good  I have discussed the results, Dx and Tx plan with the pt(& family if present). He/she/they expressed understanding and agree(s) with the plan. Discharge instructions discussed at great length. Strict return precautions discussed and pt &/or family have verbalized understanding of the instructions. No further questions at time of discharge.    Discharge Medication List as of 07/16/2018  3:44 PM      Follow Up: Lizbeth Bark, FNP [address] [place] [address] [phone]     [facility] [address] [place] [address] [phone] Schedule an appointment as soon as possible for a visit       Tegeler, Canary Brim, MD 07/16/18 1636

## 2018-07-17 ENCOUNTER — Ambulatory Visit: Payer: Self-pay | Attending: Family Medicine | Admitting: Pharmacist

## 2018-07-17 DIAGNOSIS — Z86718 Personal history of other venous thrombosis and embolism: Secondary | ICD-10-CM | POA: Insufficient documentation

## 2018-07-17 DIAGNOSIS — Z7901 Long term (current) use of anticoagulants: Secondary | ICD-10-CM | POA: Insufficient documentation

## 2018-07-17 DIAGNOSIS — Z5181 Encounter for therapeutic drug level monitoring: Secondary | ICD-10-CM | POA: Insufficient documentation

## 2018-07-17 LAB — URINE CULTURE: CULTURE: NO GROWTH

## 2018-07-17 LAB — POCT INR: INR: 2.5 (ref 2.0–3.0)

## 2018-07-17 LAB — GC/CHLAMYDIA PROBE AMP (~~LOC~~) NOT AT ARMC
Chlamydia: NEGATIVE
Neisseria Gonorrhea: NEGATIVE

## 2018-07-17 NOTE — Addendum Note (Signed)
Addended by: Lois HuxleyVAN AUSDALL, Jeannett SeniorSTEPHEN L on: 07/17/2018 12:02 PM   Modules accepted: Orders

## 2018-07-27 IMAGING — DX DG FOOT COMPLETE 3+V*R*
3 series · 3 of 3 positions shown · non-contrast
Comparison: None.

CLINICAL DATA: Mid lateral right foot pain for 3 days after
running.

EXAM:
RIGHT FOOT COMPLETE - 3+ VIEW

[foot ap]
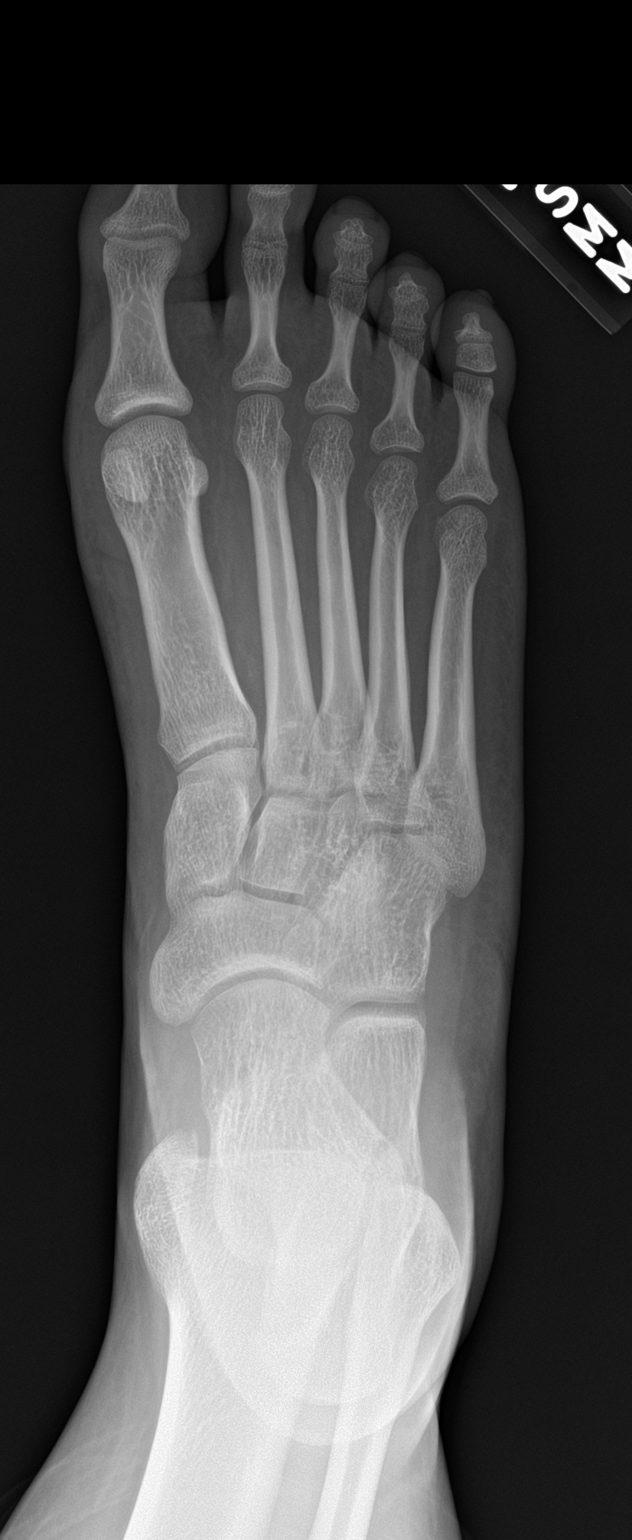

[foot obl]
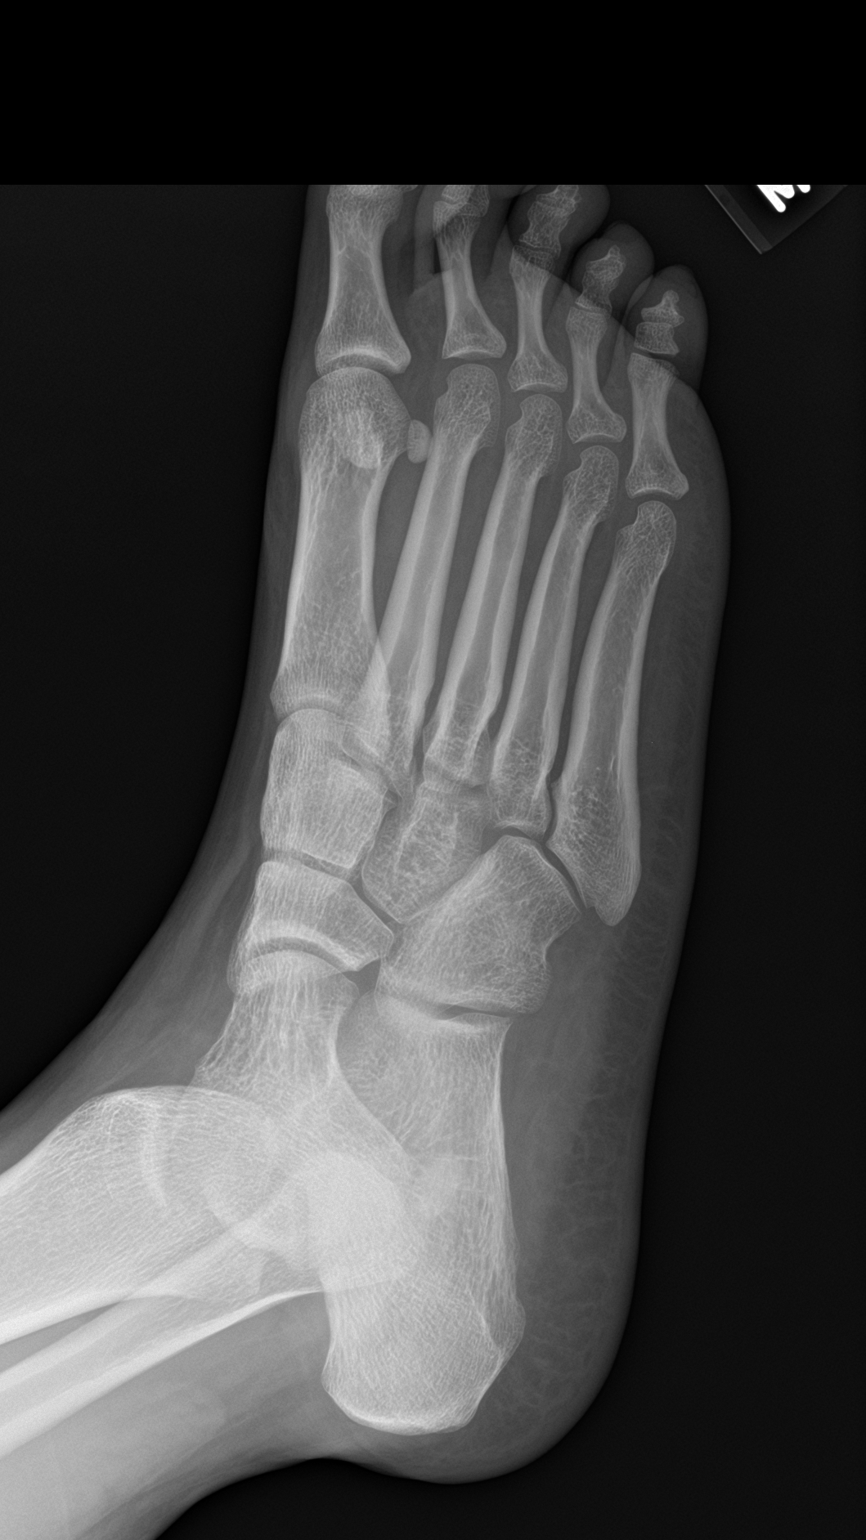

[foot lat]
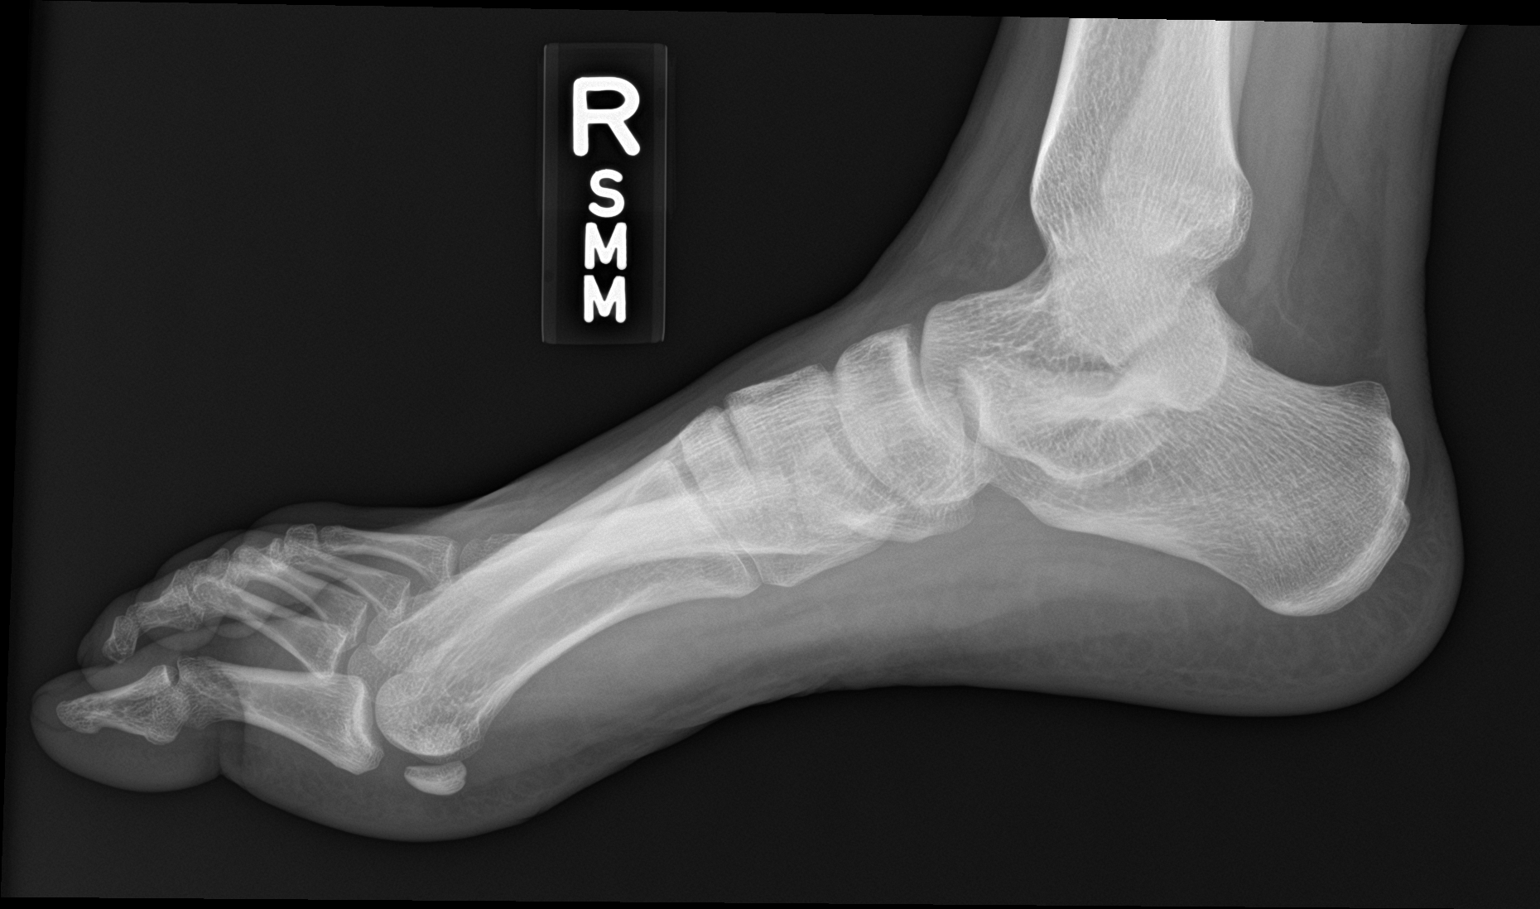

[3 of 3 positions shown; findings below may reference images not displayed]

FINDINGS: The mineralization and alignment are normal. There is no evidence of
acute fracture or dislocation. The joint spaces are maintained. No
focal soft tissue swelling identified.
IMPRESSION: No acute osseous findings.

## 2018-07-30 ENCOUNTER — Encounter: Payer: Self-pay | Admitting: Hematology and Oncology

## 2018-08-07 ENCOUNTER — Encounter: Payer: Self-pay | Admitting: Pharmacist

## 2018-08-25 ENCOUNTER — Encounter: Payer: Self-pay | Admitting: Obstetrics and Gynecology

## 2018-08-26 ENCOUNTER — Telehealth: Payer: Self-pay | Admitting: Internal Medicine

## 2018-08-26 NOTE — Telephone Encounter (Signed)
Spoke with patient to remind of missed remote transmission 

## 2018-08-28 NOTE — Progress Notes (Signed)
Patient did not keep her GYN referral appointment for 08/25/2018.  Cornelia Copa MD Attending Center for [employer] [facility])

## 2018-08-29 ENCOUNTER — Encounter: Payer: Self-pay | Admitting: Cardiology

## 2018-09-30 ENCOUNTER — Telehealth: Payer: Self-pay | Admitting: Hematology

## 2018-09-30 NOTE — Telephone Encounter (Signed)
Pt has been rescheduled to see Dr. Candise Che to 3/10 at 10am

## 2018-11-04 ENCOUNTER — Encounter: Payer: Self-pay | Admitting: Hematology

## 2018-11-05 ENCOUNTER — Encounter: Payer: Self-pay | Admitting: Hematology

## 2018-11-10 ENCOUNTER — Telehealth: Payer: Self-pay | Admitting: Hematology

## 2018-11-10 NOTE — Telephone Encounter (Signed)
Lft the pt a vmo to reschedule appt.

## 2018-11-11 ENCOUNTER — Other Ambulatory Visit: Payer: Self-pay

## 2018-11-11 ENCOUNTER — Emergency Department (HOSPITAL_COMMUNITY): Payer: Self-pay

## 2018-11-11 ENCOUNTER — Encounter (HOSPITAL_COMMUNITY): Payer: Self-pay | Admitting: Emergency Medicine

## 2018-11-11 ENCOUNTER — Emergency Department (HOSPITAL_COMMUNITY)
Admission: EM | Admit: 2018-11-11 | Discharge: 2018-11-11 | Disposition: A | Discharge status: Home / Self Care | Payer: Self-pay | Attending: Emergency Medicine | Admitting: Emergency Medicine

## 2018-11-11 DIAGNOSIS — B9789 Other viral agents as the cause of diseases classified elsewhere: Secondary | ICD-10-CM

## 2018-11-11 DIAGNOSIS — J029 Acute pharyngitis, unspecified: Secondary | ICD-10-CM

## 2018-11-11 DIAGNOSIS — J069 Acute upper respiratory infection, unspecified: Secondary | ICD-10-CM | POA: Insufficient documentation

## 2018-11-11 LAB — BASIC METABOLIC PANEL
Anion gap: 7 (ref 5–15)
BUN: 8 mg/dL (ref 6–20)
CALCIUM: 9.2 mg/dL (ref 8.9–10.3)
CO2: 26 mmol/L (ref 22–32)
Chloride: 104 mmol/L (ref 98–111)
Creatinine, Ser: 0.92 mg/dL (ref 0.44–1.00)
GFR calc Af Amer: >60 mL/min (ref >60)
GLUCOSE: 93 mg/dL (ref 70–99)
Potassium: 4.9 mmol/L (ref 3.5–5.1)
SODIUM: 137 mmol/L (ref 135–145)

## 2018-11-11 LAB — CBC WITH DIFFERENTIAL/PLATELET
ABS IMMATURE GRANULOCYTES: 0.01 10*3/uL (ref 0.00–0.07)
BASOS PCT: 1 %
Basophils Absolute: 0 10*3/uL (ref 0.0–0.1)
EOS ABS: 0.1 10*3/uL (ref 0.0–0.5)
Eosinophils Relative: 2 %
HCT: 40.2 % (ref 36.0–46.0)
Hemoglobin: 14 g/dL (ref 12.0–15.0)
IMMATURE GRANULOCYTES: 0 %
Lymphocytes Relative: 35 %
Lymphs Abs: 1.5 10*3/uL (ref 0.7–4.0)
MCH: 31.9 pg (ref 26.0–34.0)
MCHC: 34.8 g/dL (ref 30.0–36.0)
MCV: 91.6 fL (ref 80.0–100.0)
Monocytes Absolute: 0.3 10*3/uL (ref 0.1–1.0)
Monocytes Relative: 7 %
NEUTROS ABS: 2.4 10*3/uL (ref 1.7–7.7)
NEUTROS PCT: 55 %
PLATELETS: 149 10*3/uL — AB (ref 150–400)
RBC: 4.39 MIL/uL (ref 3.87–5.11)
RDW: 11.7 % (ref 11.5–15.5)
WBC: 4.4 10*3/uL (ref 4.0–10.5)
nRBC: 0 % (ref 0.0–0.2)

## 2018-11-11 LAB — PROTIME-INR
INR: 1 (ref 0.8–1.2)
PROTHROMBIN TIME: 13.5 s (ref 11.4–15.2)

## 2018-11-11 LAB — I-STAT BETA HCG BLOOD, ED (MC, WL, AP ONLY)

## 2018-11-11 LAB — INFLUENZA PANEL BY PCR (TYPE A & B)
Influenza A By PCR: NEGATIVE
Influenza B By PCR: NEGATIVE

## 2018-11-11 LAB — GROUP A STREP BY PCR: Group A Strep by PCR: NOT DETECTED

## 2018-11-11 MED ORDER — AZITHROMYCIN 250 MG PO TABS: 250.0000 mg | ORAL_TABLET | Freq: Every day | ORAL | 0 refills | Status: DC

## 2018-11-11 MED ORDER — LIDOCAINE VISCOUS HCL 2 % MT SOLN: 15.0000 mL | OROMUCOSAL | 0 refills | Status: DC | PRN

## 2018-11-11 MED ORDER — BENZONATATE 100 MG PO CAPS: 100.0000 mg | ORAL_CAPSULE | Freq: Three times a day (TID) | ORAL | 0 refills | Status: DC

## 2018-11-11 NOTE — ED Triage Notes (Addendum)
Sore throat for week; taking benadryl but did not seem to help. Hx 3rd block, TIA, pacemaker

## 2018-11-11 NOTE — Discharge Instructions (Addendum)
Take the medications as prescribed for your symptoms. Swish and spit the lidocaine to help with throat discomfort.  Do not swallow. Return to the ED if you start to have worsening symptoms, coughing up blood, leg swelling, chest pain or shortness of breath or drooling.

## 2018-11-11 NOTE — ED Provider Notes (Signed)
[facility] EMERGENCY DEPARTMENT Provider Note   CSN: [id number] Arrival date & time: 11/11/18  1115    History   Chief Complaint Chief Complaint  Patient presents with  . Sore Throat    HPI Schuylar Behanna is a 23 y.o. 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  Reports sick contacts including her 38-month-old cousin who was recently diagnosed with flu.  No improvement with Benadryl, she was told to take antihistamines for the symptoms in the past.  She states that she has pain in her throat worse with swallowing but denies any trismus, drooling, neck pain.  She reports chills and went to health at work yesterday and was told that she had a "slight temperature."  She denies any recent use of antipyretics.  Denies any chest pain, shortness of breath, vomiting, hemoptysis, recent travel or recent immobilization. Patient also admits that she has not had her INR checked nor taken her Coumadin in the past 2 to 3 months.     HPI  Past Medical History:  Diagnosis Date  . Acute deep vein thrombosis (DVT) (HCC) 08/27/2012 and 08/27/2008  . Heart block   . Presence of permanent cardiac pacemaker 1998   FOR CONGENITAL HEART BLOCK  . TIA (transient ischemic attack) 08/27/2013    Patient Active Problem List   Diagnosis Date Noted  . Encounter for monitoring Coumadin therapy 08/07/2016  . Syncope and collapse   . Nonintractable headache   . Syncope 08/01/2016  . History of DVT (deep vein thrombosis) 08/01/2016  . Congenital heart block 08/01/2016  . Pacemaker 08/01/2016  . Chronic headaches 08/01/2016    Past Surgical History:  Procedure Laterality Date  . INSERT / REPLACE / REMOVE PACEMAKER  1998   CONGENITAL HEART BLOCK  . PACEMAKER INSERTION       OB History   No obstetric history on file.      Home Medications    Prior to Admission medications   Medication Sig Start Date End Date Taking? Authorizing Provider  azithromycin (ZITHROMAX) 250 MG tablet Take 1 tablet (250 mg total) by mouth daily. Take first 2 tablets together, then 1 every day until finished. 11/11/18   Odysseus Cada, PA-C  benzonatate (TESSALON) 100 MG capsule Take 1 capsule (100 mg total) by mouth every 8 (eight) hours. 11/11/18   Dewitt Judice, PA-C  cephALEXin (KEFLEX) 500 MG capsule Take 1 capsule (500 mg total) by mouth 4 (four) times daily. Patient not taking: Reported on 07/10/2018 05/27/18   Dahlia Byes A, NP  cetirizine (ZYRTEC) 10 MG tablet Take 1 tablet (10 mg total) by mouth daily. 01/17/17   Funches, Gerilyn Nestle, MD  lidocaine (XYLOCAINE) 2 % solution Use as directed 15 mLs in the mouth or throat as needed for mouth pain. 11/11/18   Melanie Pellot, PA-C  metoprolol succinate (TOPROL XL) 25 MG 24 hr tablet Take 1 tablet (25 mg total) by mouth daily as needed. 05/27/18   Marinus Maw, MD  warfarin (COUMADIN) 5 MG tablet Take 1 tablet (5 mg total) by mouth daily. 07/10/18 07/17/18  Vivianne Master, PA-C    Family History History reviewed. No pertinent family history.  Social History Social History   Tobacco Use  . Smoking status: Never Smoker  . Smokeless tobacco: Never Used  Substance Use Topics  . Alcohol use: Yes  .  Drug use: No     Allergies   Patient has no known allergies.   Review of Systems Review of Systems  Constitutional: Negative for appetite change, chills and fever.  HENT: Positive for rhinorrhea and sore throat. Negative for ear pain and sneezing.   Eyes: Negative for photophobia and visual disturbance.  Respiratory: Positive for cough. Negative for chest tightness, shortness of breath and wheezing.   Cardiovascular: Negative for chest pain and palpitations.  Gastrointestinal: Negative for abdominal pain, blood in stool, constipation, diarrhea, nausea and vomiting.  Genitourinary:  Negative for dysuria, hematuria and urgency.  Musculoskeletal: Negative for myalgias.  Skin: Negative for rash.  Neurological: Negative for dizziness, weakness and light-headedness.     Physical Exam Updated Vital Signs BP (!) 122/94   Pulse (!) 58   Temp 97.7 F (36.5 C) (Oral)   Resp 16   LMP 11/04/2018   SpO2 100%   Physical Exam Vitals signs and nursing note reviewed.  Constitutional:      General: She is not in acute distress.    Appearance: She is well-developed.     Comments: Speaking in complete sentences without difficulty.  HENT:     Head: Normocephalic and atraumatic.     Right Ear: A middle ear effusion is present.     Left Ear: A middle ear effusion is present.     Nose: Rhinorrhea present.     Mouth/Throat:     Pharynx: Oropharynx is clear. Uvula midline.     Tonsils: Tonsillar exudate present. Swelling: 1+ on the right. 1+ on the left.     Comments: Bilaterally, symmetrically enlarged tonsils with exudates.  Patient does not appear to be in acute distress. No trismus or drooling present. No pooling of secretions. Patient is tolerating secretions and is not in respiratory distress. No neck pain or tenderness to palpation of the neck. Full active and passive range of motion of the neck. No evidence of RPA or PTA. Eyes:     General: No scleral icterus.       Right eye: No discharge.        Left eye: No discharge.     Conjunctiva/sclera: Conjunctivae normal.  Neck:     Musculoskeletal: Normal range of motion and neck supple.  Cardiovascular:     Rate and Rhythm: Normal rate and regular rhythm.     Heart sounds: Normal heart sounds. No murmur. No friction rub. No gallop.   Pulmonary:     Effort: Pulmonary effort is normal. No respiratory distress.     Breath sounds: Normal breath sounds.  Abdominal:     General: Bowel sounds are normal. There is no distension.     Palpations: Abdomen is soft.     Tenderness: There is no abdominal tenderness. There is no  guarding.  Musculoskeletal: Normal range of motion.  Skin:    General: Skin is warm and dry.     Findings: No rash.  Neurological:     Mental Status: She is alert.     Motor: No abnormal muscle tone.     Coordination: Coordination normal.      ED Treatments / Results  Labs (all labs ordered are listed, but only abnormal results are displayed) Labs Reviewed  CBC WITH DIFFERENTIAL/PLATELET - Abnormal; Notable for the following components:      Result Value   Platelets 149 (*)    All other components within normal limits  GROUP A STREP BY PCR  BASIC METABOLIC PANEL  PROTIME-INR  INFLUENZA PANEL BY PCR (TYPE A & B)  I-STAT BETA HCG BLOOD, ED ([facility], [facility], [facility] ONLY)    EKG None  Radiology Dg Chest 2 View  Result Date: 11/11/2018 CLINICAL DATA:  Cold like symptoms which shortness-of-breath and dry cough 1 week. Fever yesterday. EXAM: CHEST - 2 VIEW COMPARISON:  08/01/2016 FINDINGS: Left-sided pacemaker unchanged. Small caliber catheter over the midline lower anterior thorax/upper abdominal wall extending inferiorly just left of midline in the upper abdominal wall unchanged. Lungs are well inflated and otherwise clear. Cardiomediastinal silhouette and remainder of the exam is unchanged. IMPRESSION: No active cardiopulmonary disease. Electronically Signed   By: Elberta Fortis M.D.   On: 11/11/2018 12:48    Procedures Procedures (including critical care time)  Medications Ordered in ED Medications - No data to display   Initial Impression / Assessment and Plan / ED Course  I have reviewed the triage vital signs and the nursing notes.  Pertinent labs & imaging results that were available during my care of the patient were reviewed by me and considered in my medical decision making (see chart for details).        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  Sick contacts including her 73-month-old cousin who was recently diagnosed with influenza.  She had been told in the past that the symptoms could be due to allergies but she has not had improvement with Benadryl this time.  On my exam patient is overall well-appearing.  Also has auscultation bilaterally.  No lower extremity edema, erythema or calf tenderness that would concern me for DVT.  Tonsils are bilaterally, symmetrically enlarged with exudates.  No signs of trismus, drooling, RPA or PTA noted on exam.  Rhinorrhea noted.  Chest x-ray is unremarkable.  CBC, BMP, INR (1), unremarkable.  Strep is negative.  Flu swab is negative.  Patient resting comfortably with stable vital signs. Will treat with azithromycin due to appearance of tonsils, Tessalon Perles and lidocaine to swish and spit for throat discomfort.  She has not had any known exposures to covid19 is not complaining of shortness of breath or dyspnea, no indication for testing at this time.  She has been noncompliant with her Coumadin for the past 2 to 3 months.  There is some question about whether or not she needs to be on continued Coumadin therapy which is why she was referred to hematology.  However she never followed up.  I have encouraged her to follow-up with PCP and establish care with a hematologist.  Patient is agreeable to plan.  Advised to return to ED for any severe worsening symptoms.  Patient is hemodynamically stable, in NAD, and able to ambulate in the ED. Evaluation does not show pathology that would require ongoing emergent intervention or inpatient treatment. I explained the diagnosis to the patient. Pain has been managed and has no complaints prior to discharge. Patient is comfortable with above plan and is stable for discharge at this time. All questions were answered prior to disposition. Strict return precautions for returning to the ED were discussed. Encouraged follow up with PCP.     Portions of this note were generated with Scientist, clinical (histocompatibility and immunogenetics). Dictation errors may occur despite best attempts at proofreading.  Final Clinical Impressions(s) / ED Diagnoses   Final diagnoses:  Pharyngitis, unspecified etiology  Viral URI with cough  ED Discharge Orders         Ordered    azithromycin (ZITHROMAX) 250 MG tablet  Daily     11/11/18 1423    benzonatate (TESSALON) 100 MG capsule  Every 8 hours     11/11/18 1423    lidocaine (XYLOCAINE) 2 % solution  As needed     11/11/18 1423           Dietrich Pates, PA-C 11/11/18 1438    Vanetta Mulders, MD 11/12/18 1312

## 2018-11-11 NOTE — ED Notes (Signed)
Pt verbalized understanding of d/c instructions and has no further questions, VSS, NAD.  

## 2018-11-11 NOTE — ED Triage Notes (Signed)
She has complete heart block and pacemaker with hx of TIA. Takes warfarin.

## 2018-11-20 ENCOUNTER — Telehealth: Payer: Self-pay

## 2018-11-20 ENCOUNTER — Telehealth: Payer: Self-pay | Admitting: Internal Medicine

## 2018-11-20 NOTE — Telephone Encounter (Signed)
  Patient has a pacemaker and works at [employer]. She is asking if she should continue to work or not at this time. Please advise.

## 2018-11-20 NOTE — Telephone Encounter (Signed)
Left message notifying patient she is not high risk and may continue to work per dr. Ladona Ridgel.

## 2018-11-20 NOTE — Telephone Encounter (Signed)
Spoke with Pt's mother.  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  Advised per Dr. Ladona Ridgel she was not high risk.  Mother unhappy with that.  Apologized.  Mother to call her daughter's PCP.

## 2018-11-20 NOTE — Telephone Encounter (Signed)
Follow up    Patients mother is calling in reference to the information that the patient was given. She states that its "misinformation". She states since her daughter is a pacemaker that she should be high risk. Please call.

## 2018-11-28 ENCOUNTER — Telehealth: Payer: Self-pay | Admitting: Internal Medicine

## 2018-11-28 NOTE — Telephone Encounter (Signed)
New message   The patient's mother is upset because per the previous message she is not a high risk patient. The patient's mother would like a call back to further discuss this information.

## 2018-12-02 NOTE — Telephone Encounter (Signed)
Sent MyChart message.  This nurse has already spoken with this patient's mother.

## 2018-12-19 ENCOUNTER — Ambulatory Visit (HOSPITAL_COMMUNITY)
Admission: EM | Admit: 2018-12-19 | Discharge: 2018-12-19 | Disposition: A | Discharge status: Home / Self Care | Payer: 59 | Attending: Emergency Medicine | Admitting: Emergency Medicine

## 2018-12-19 ENCOUNTER — Other Ambulatory Visit: Payer: Self-pay

## 2018-12-19 DIAGNOSIS — L298 Other pruritus: Secondary | ICD-10-CM

## 2018-12-19 DIAGNOSIS — T492X5A Adverse effect of local astringents and local detergents, initial encounter: Secondary | ICD-10-CM

## 2018-12-19 DIAGNOSIS — R220 Localized swelling, mass and lump, head: Secondary | ICD-10-CM | POA: Diagnosis not present

## 2018-12-19 DIAGNOSIS — R110 Nausea: Secondary | ICD-10-CM | POA: Diagnosis not present

## 2018-12-19 DIAGNOSIS — T7840XA Allergy, unspecified, initial encounter: Secondary | ICD-10-CM

## 2018-12-19 MED ORDER — DEXAMETHASONE SODIUM PHOSPHATE 10 MG/ML IJ SOLN
INTRAMUSCULAR | Status: AC
  Filled 2018-12-19: qty 1

## 2018-12-19 MED ORDER — FAMOTIDINE 20 MG PO TABS: 20.0000 mg | ORAL_TABLET | Freq: Two times a day (BID) | ORAL | 0 refills | Status: AC

## 2018-12-19 MED ORDER — DEXAMETHASONE SODIUM PHOSPHATE 10 MG/ML IJ SOLN
10.0000 mg | Freq: Once | INTRAMUSCULAR | Status: AC
  Administered 2018-12-19: 10 mg via INTRAMUSCULAR

## 2018-12-19 MED ORDER — FAMOTIDINE 20 MG PO TABS
ORAL_TABLET | ORAL | Status: AC
  Filled 2018-12-19: qty 1

## 2018-12-19 MED ORDER — PREDNISONE 20 MG PO TABS: 20.0000 mg | ORAL_TABLET | Freq: Two times a day (BID) | ORAL | 0 refills | Status: AC

## 2018-12-19 MED ORDER — FAMOTIDINE 40 MG/5ML PO SUSR
20.0000 mg | Freq: Once | ORAL | Status: AC
  Administered 2018-12-19: 20 mg via ORAL

## 2018-12-19 NOTE — ED Notes (Signed)
Patient able to ambulate independently  

## 2018-12-19 NOTE — ED Triage Notes (Addendum)
Per pt she used a new soap on her skin and face and noticed swelling and whelps on her face anD is itching under her breat and other area. Pt says no problems swallowing or SOB. pT DID TAKE BENADRYL BEFORE SHE LEFT.

## 2018-12-19 NOTE — ED Provider Notes (Signed)
[facility] CARE CENTER   [id number] 12/19/18 Arrival Time: 1334  CC: SKIN COMPLAINT  SUBJECTIVE:  Elizabeth Ballard is a 23 y.o. female hx significant for DVT, 1st degree HB with pacemaker, and TIA, who presents with facial swelling, itching and nausea x 2 hours.  Symptoms began after using a new soap while showering.  Localizes swelling to bilateral cheeks. Itching diffuse about the upper body. Has tried benadryl with minimal relief.  Denies similar symptoms in the past.   Denies fever, chills, vomiting, erythema, redness, oral manifestations such as throat swelling/ tingling, mouth swelling/ tingling, tongue swelling/tingling, dyspnea, SOB, chest pain, abdominal pain, changes in bowel or bladder function.    ROS: As per HPI.  Past Medical History:  Diagnosis Date  . Acute deep vein thrombosis (DVT) (HCC) 08/27/2012 and 08/27/2008  . Heart block   . Presence of permanent cardiac pacemaker 1998   FOR CONGENITAL HEART BLOCK  . TIA (transient ischemic attack) 08/27/2013   Past Surgical History:  Procedure Laterality Date  . INSERT / REPLACE / REMOVE PACEMAKER  1998   CONGENITAL HEART BLOCK  . PACEMAKER INSERTION     No Known Allergies No current facility-administered medications on file prior to encounter.    Current Outpatient Medications on File Prior to Encounter  Medication Sig Dispense Refill  . azithromycin (ZITHROMAX) 250 MG tablet Take 1 tablet (250 mg total) by mouth daily. Take first 2 tablets together, then 1 every day until finished. 6 tablet 0  . benzonatate (TESSALON) 100 MG capsule Take 1 capsule (100 mg total) by mouth every 8 (eight) hours. 21 capsule 0  . cephALEXin (KEFLEX) 500 MG capsule Take 1 capsule (500 mg total) by mouth 4 (four) times daily. (Patient not taking: Reported on 07/10/2018) 28 capsule 0  . cetirizine (ZYRTEC) 10 MG tablet Take 1 tablet (10 mg total) by mouth daily. 30 tablet 11  . lidocaine (XYLOCAINE) 2 % solution Use as directed 15 mLs in the  mouth or throat as needed for mouth pain. 100 mL 0  . metoprolol succinate (TOPROL XL) 25 MG 24 hr tablet Take 1 tablet (25 mg total) by mouth daily as needed. 30 tablet 3  . [DISCONTINUED] warfarin (COUMADIN) 5 MG tablet Take 1 tablet (5 mg total) by mouth daily. 30 tablet 3   Social History   Socioeconomic History  . Marital status: Single    Spouse name: Not on file  . Number of children: Not on file  . Years of education: Not on file  . Highest education level: Not on file  Occupational History  . Not on file  Social Needs  . Financial resource strain: Not on file  . Food insecurity:    Worry: Not on file    Inability: Not on file  . Transportation needs:    Medical: Not on file    Non-medical: Not on file  Tobacco Use  . Smoking status: Never Smoker  . Smokeless tobacco: Never Used  Substance and Sexual Activity  . Alcohol use: Yes  . Drug use: No  . Sexual activity: Not on file  Lifestyle  . Physical activity:    Days per week: Not on file    Minutes per session: Not on file  . Stress: Not on file  Relationships  . Social connections:    Talks on phone: Not on file    Gets together: Not on file    Attends religious service: Not on file    Active member of club  or organization: Not on file    Attends meetings of clubs or organizations: Not on file    Relationship status: Not on file  . Intimate partner violence:    Fear of current or ex partner: Not on file    Emotionally abused: Not on file    Physically abused: Not on file    Forced sexual activity: Not on file  Other Topics Concern  . Not on file  Social History Narrative  . Not on file   No family history on file.  OBJECTIVE: Vitals:   12/19/18 1350  BP: 125/90  Pulse: 73  Resp: 16  Temp: 98.3 F (36.8 C)  TempSrc: Oral  SpO2: 99%    General appearance: alert; no distress Head: NCAT EENT: EACs clear, TMs pearly gray; PERRL, EOMI grossly, with tearing; nares patent; oropharynx clear,  tolerating own secretions Lungs: clear to auscultation bilaterally Heart: regular rate and rhythm.  Radial pulse 2+ bilaterally Abdomen: soft, nondistended, normal active bowel sounds; nontender to palpation; no guarding  Skin: warm and dry; bilateral preseptal swelling, no overlying erythema (see picture below) Psychological: alert and cooperative; normal mood and affect      ASSESSMENT & PLAN:  1. Allergic reaction, initial encounter   2. Facial swelling     Meds ordered this encounter  Medications  . dexamethasone (DECADRON) injection 10 mg  . famotidine (PEPCID) 40 MG/5ML suspension 20 mg   Took benadryl 25 mg prior to evaluation Decadron 10 mg shot given in office Famotidine 20 mg given in office.   Rest push fluids Return in 24 hours to be reevaluated and to ensure your symptoms are improving; if you feel you have improved 100% you do not have to come back and be reevaluated Prednisone 40 mg daily for 3 days prescribed.  Take as directed and to completion. Benadryl 25 mg prescribed.  Take as directed for 3 days. Pepcid 20 mg twice daily for 3 days Return sooner or go to the ED if you have any new or worsening symptoms such as difficulty breathing, shortness of breath, chest pain, nausea, vomiting, throat tightness or swelling, tongue swelling or tingling, worsening lip or facial swelling, abdominal pain, changes in bowel or bladder habits, no improvement despite medications, etc...  Reviewed expectations re: course of current medical issues. Questions answered. Outlined signs and symptoms indicating need for more acute intervention. Patient verbalized understanding. After Visit Summary given.   Rennis HardingWurst, Tayo Maute, PA-C 12/19/18 1446

## 2018-12-19 NOTE — Discharge Instructions (Addendum)
Took benadryl 25 mg prior to evaluation Decadron 10 mg shot given in office Famotidine 20 mg given in office.   Rest push fluids Return in 24 hours to be reevaluated and to ensure your symptoms are improving; if you feel you have improved 100% you do not have to come back and be reevaluated Prednisone 40 mg daily for 3 days prescribed.  Take as directed and to completion. Benadryl 25 mg prescribed.  Take as directed for 3 days. Pepcid 20 mg twice daily for 3 days Return sooner or go to the ED if you have any new or worsening symptoms such as difficulty breathing, shortness of breath, chest pain, nausea, vomiting, throat tightness or swelling, tongue swelling or tingling, worsening lip or facial swelling, abdominal pain, changes in bowel or bladder habits, no improvement despite medications, etc..[name]

## 2019-02-16 IMAGING — US US TRANSVAGINAL NON-OB
1 series · 13 of 25 positions shown · non-contrast
Comparison: None.

CLINICAL DATA: Abdominal plain, vaginal discharge and bleeding.

EXAM:
TRANSABDOMINAL AND TRANSVAGINAL ULTRASOUND OF PELVIS
DOPPLER ULTRASOUND OF OVARIES
TECHNIQUE: Both transabdominal and transvaginal ultrasound examinations of the
pelvis were performed. Transabdominal technique was performed for
global imaging of the pelvis including uterus, ovaries, adnexal
regions, and pelvic cul-de-sac.
It was necessary to proceed with endovaginal exam following the
transabdominal exam to visualize the endometrium and adnexa. Color
and duplex Doppler ultrasound was utilized to evaluate blood flow to
the ovaries.

[Series 1: us transvaginal non-ob · 0.20mm/px · 13 of 82 slices shown]
[im 1/82]
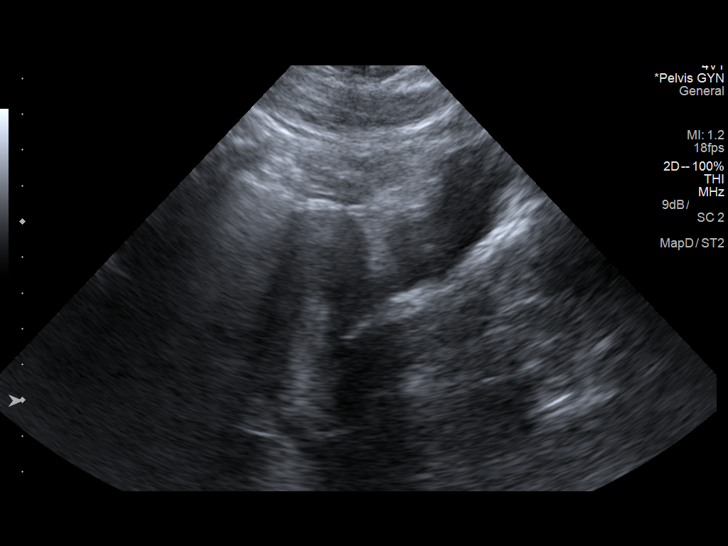
[im 7/82]
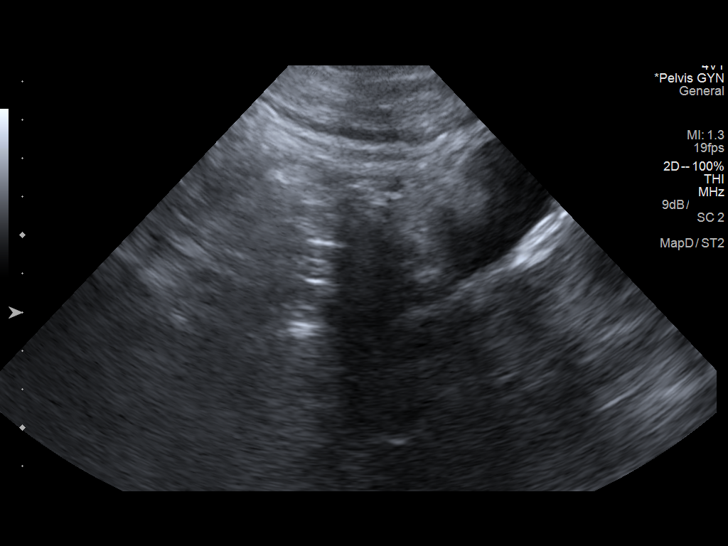
[im 14/82]
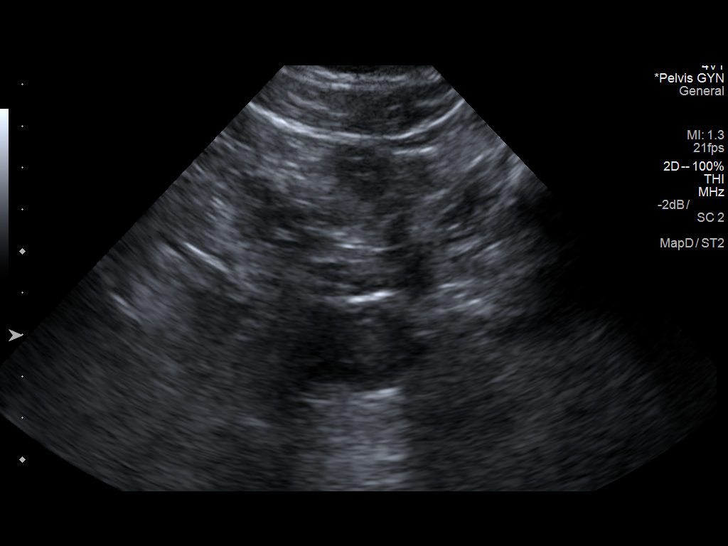
[im 21/82]
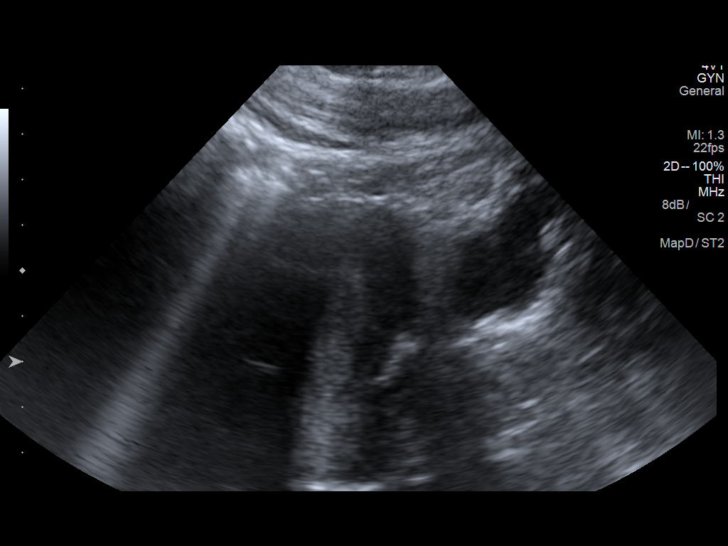
[im 28/82]
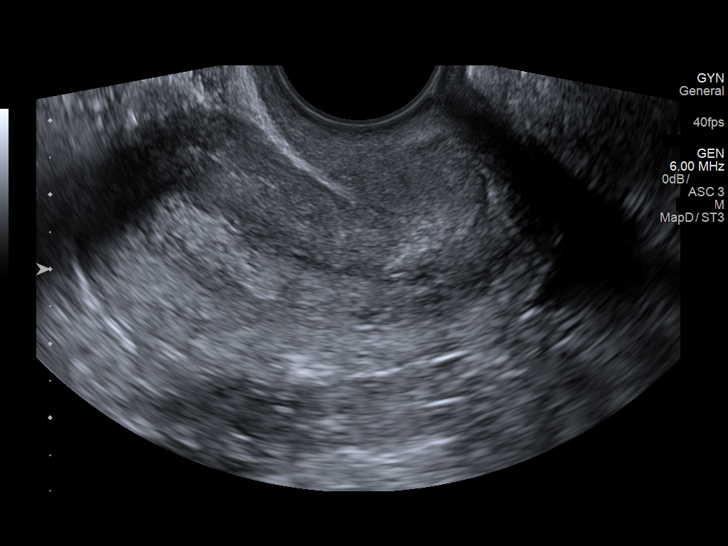
[im 34/82]
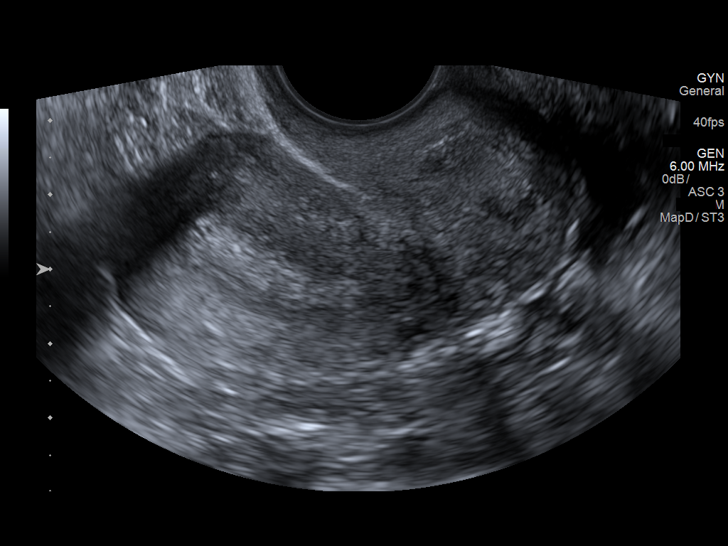
[im 41/82]
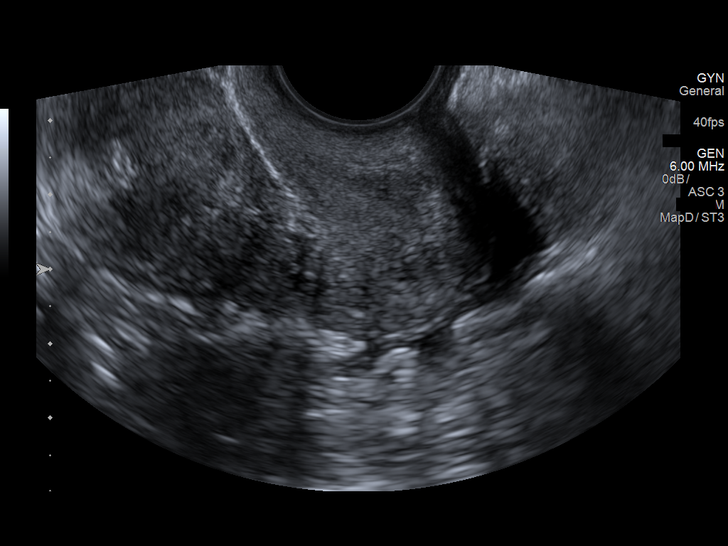
[im 48/82]
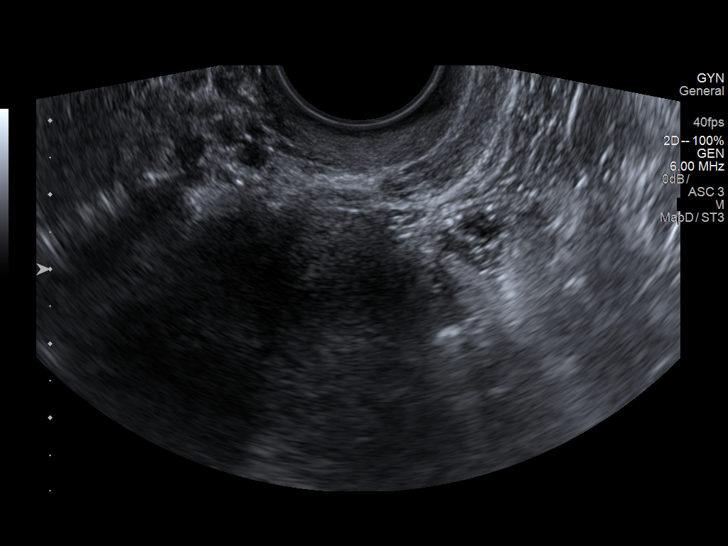
[im 55/82]
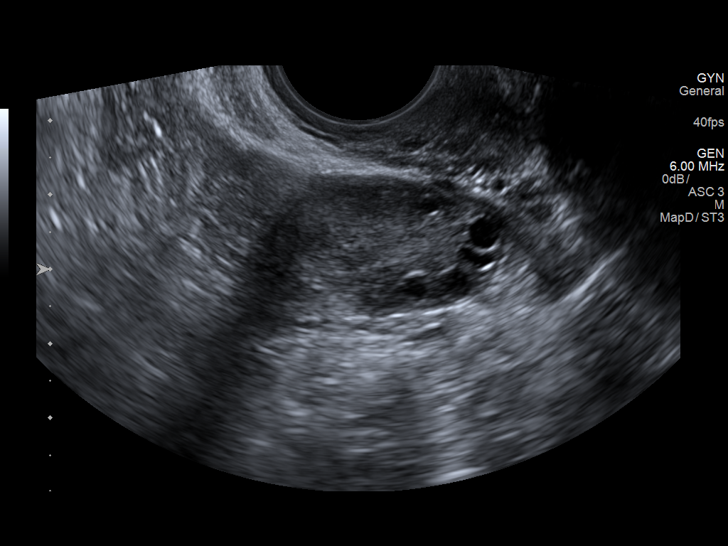
[im 61/82]
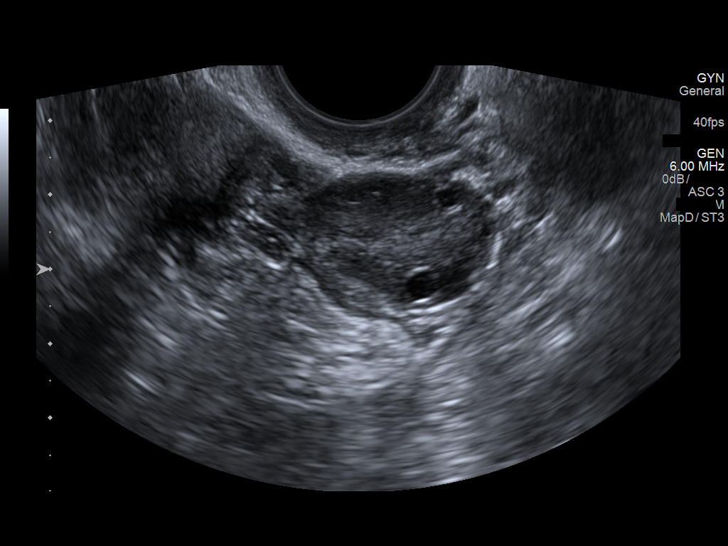
[im 68/82]
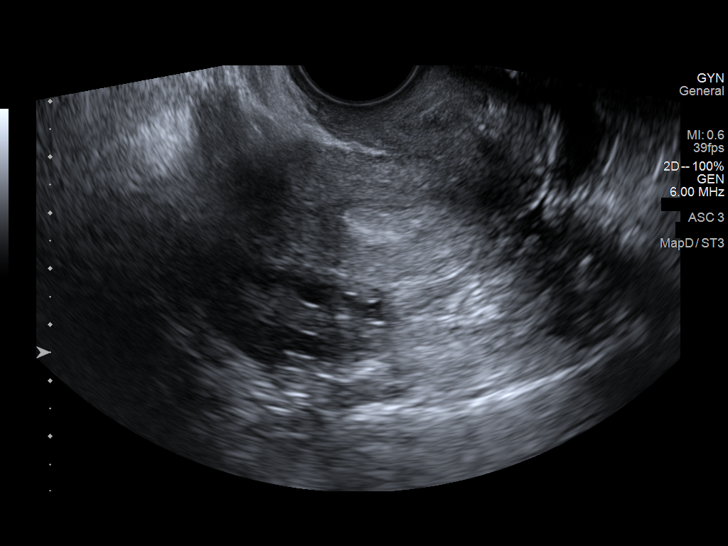
[im 75/82]
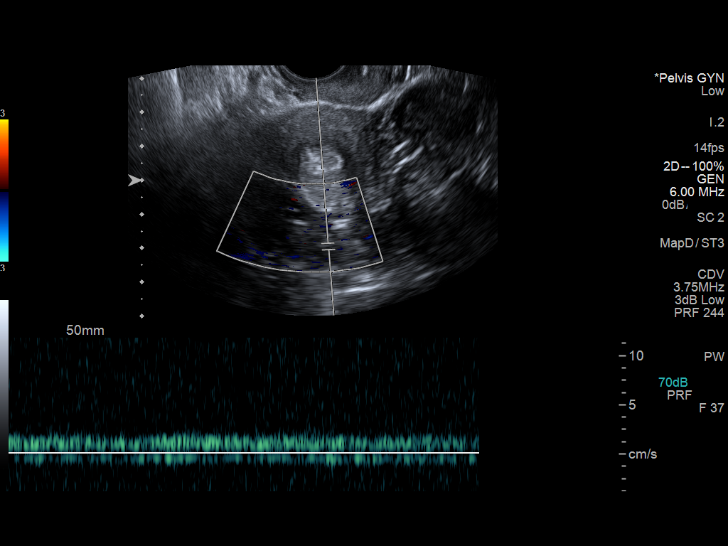
[im 82/82]
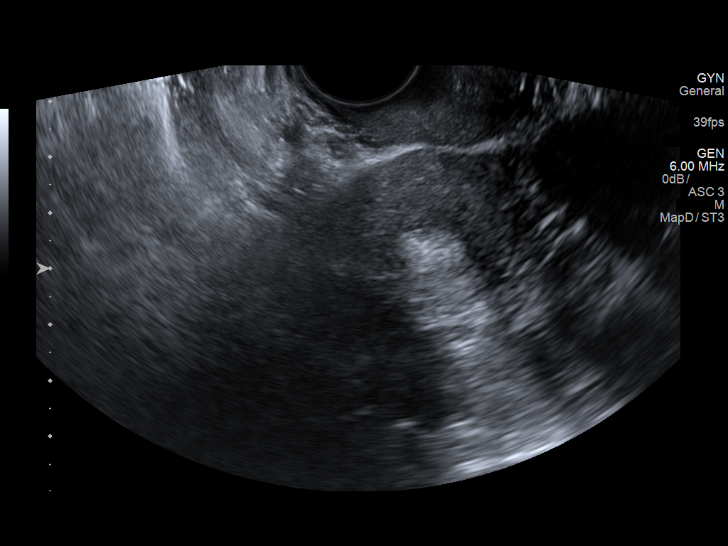

[13 of 25 positions shown; findings below may reference images not displayed]

FINDINGS: Uterus

Measurements: 6.4 x 3.3 x 4 cm = volume: 43.3 mL. No fibroids or
other mass visualized.

Endometrium

Thickness: 7 mm.  No focal abnormality visualized.

Right ovary

Measurements: 3.5 x 1.9 x 2.7 cm = volume: 9.3 mL. Normal
appearance/no adnexal mass.

Left ovary

Measurements: 3 x 1.9 x 2.7 cm = volume: 7.9 mL. Normal
appearance/no adnexal mass.

Pulsed Doppler evaluation of both ovaries demonstrates normal
low-resistance arterial and venous waveforms.

Other findings

Small volume free fluid, likely physiologic.
IMPRESSION: Normal pelvic ultrasound.

## 2019-03-02 DIAGNOSIS — R0789 Other chest pain: Secondary | ICD-10-CM | POA: Diagnosis not present

## 2019-03-02 DIAGNOSIS — M79604 Pain in right leg: Secondary | ICD-10-CM | POA: Diagnosis not present

## 2019-03-02 DIAGNOSIS — M79605 Pain in left leg: Secondary | ICD-10-CM | POA: Diagnosis not present

## 2019-03-02 DIAGNOSIS — R002 Palpitations: Secondary | ICD-10-CM | POA: Diagnosis not present

## 2019-03-02 DIAGNOSIS — Z950 Presence of cardiac pacemaker: Secondary | ICD-10-CM | POA: Diagnosis not present

## 2019-03-02 DIAGNOSIS — T45516A Underdosing of anticoagulants, initial encounter: Secondary | ICD-10-CM | POA: Diagnosis not present

## 2019-03-02 DIAGNOSIS — R0602 Shortness of breath: Secondary | ICD-10-CM | POA: Diagnosis not present

## 2019-03-02 DIAGNOSIS — R079 Chest pain, unspecified: Secondary | ICD-10-CM | POA: Diagnosis not present

## 2019-03-02 DIAGNOSIS — Z86718 Personal history of other venous thrombosis and embolism: Secondary | ICD-10-CM | POA: Diagnosis not present

## 2019-03-03 DIAGNOSIS — Z86718 Personal history of other venous thrombosis and embolism: Secondary | ICD-10-CM | POA: Diagnosis not present

## 2019-03-03 DIAGNOSIS — M79604 Pain in right leg: Secondary | ICD-10-CM | POA: Diagnosis not present

## 2019-03-03 DIAGNOSIS — R079 Chest pain, unspecified: Secondary | ICD-10-CM | POA: Diagnosis not present

## 2019-03-03 DIAGNOSIS — R0602 Shortness of breath: Secondary | ICD-10-CM | POA: Diagnosis not present

## 2019-03-03 DIAGNOSIS — M79605 Pain in left leg: Secondary | ICD-10-CM | POA: Diagnosis not present

## 2019-03-03 DIAGNOSIS — Z950 Presence of cardiac pacemaker: Secondary | ICD-10-CM | POA: Diagnosis not present

## 2019-03-03 DIAGNOSIS — T45516A Underdosing of anticoagulants, initial encounter: Secondary | ICD-10-CM | POA: Diagnosis not present

## 2019-03-03 DIAGNOSIS — R0789 Other chest pain: Secondary | ICD-10-CM | POA: Diagnosis not present

## 2019-03-03 DIAGNOSIS — R002 Palpitations: Secondary | ICD-10-CM | POA: Diagnosis not present

## 2019-03-04 DIAGNOSIS — Z7901 Long term (current) use of anticoagulants: Secondary | ICD-10-CM | POA: Diagnosis not present

## 2019-03-04 DIAGNOSIS — Q246 Congenital heart block: Secondary | ICD-10-CM | POA: Diagnosis not present

## 2019-03-04 DIAGNOSIS — Z950 Presence of cardiac pacemaker: Secondary | ICD-10-CM | POA: Diagnosis not present

## 2019-03-04 DIAGNOSIS — Z86718 Personal history of other venous thrombosis and embolism: Secondary | ICD-10-CM | POA: Diagnosis not present

## 2019-03-04 DIAGNOSIS — R0981 Nasal congestion: Secondary | ICD-10-CM | POA: Diagnosis not present

## 2019-03-31 ENCOUNTER — Telehealth: Payer: Self-pay | Admitting: Internal Medicine

## 2019-03-31 NOTE — Telephone Encounter (Signed)
Returned call to patient. She is currently in [place], staying with her mother. Pt reports symptoms have been ongoing for ~5 days. She waited 3 days, then went to [facility] ED in [place] earlier this week. Reports she was hooked up to a heart monitor and they told her they couldn't find anything wrong and she was discharged. Reports she doesn't think her PPM was checked at that visit. Pt reports her left arm continues to hurt, she is also experiencing some nausea and chest heaviness. Pt was offered appointment with local cardiologist in [place] to establish care, but appointment is not until the end of the month. She brought her PPM home monitor to [place] with her, but reports it has been misplaced so she is unable to transmit. Overdue for PPM remote check as of 08/26/2018. This RN advised pt to seek emergency medical attention for these symptoms. At this point, pt reports she is calling to see if she can schedule an OV with a cardiologist in our office. Reiterated importance of ED assessment.  Discussed with Dr. Curt Bears (DOD). Per recommendations from Dr. Curt Bears, strongly advised that she return to the ED as she has had ongoing symptoms. Offered soonest available f/u with M. Tillery, PA on 04/08/19 at 11:30 but explained it will not replace emergency medical attention. Pt verbalizes understanding of instructions to return to ED, also accepted appointment on 04/08/19. She denies additional questions at this time and thanked me for my call.

## 2019-03-31 NOTE — Telephone Encounter (Signed)
New message   Patient states that she is having pain in her arm where her device is located. Patient states that she is very tired and her chest feels heavy where the device is located. Please call.

## 2019-04-08 ENCOUNTER — Encounter: Payer: 59 | Admitting: Student

## 2019-04-12 NOTE — Telephone Encounter (Signed)
No additional recs.

## 2019-07-14 ENCOUNTER — Telehealth: Payer: Self-pay

## 2019-07-14 NOTE — Telephone Encounter (Signed)
LMOVM to call my direct office number. [facility] is requesting the pt to be transferred to them. I just need the pt permission to release her.
